# Patient Record
Sex: Female | Born: 1980 | Race: White | Hispanic: No | Smoking: Never smoker
Health system: Southern US, Community
[De-identification: ages and names within clinical notes are randomized; demographics above are authoritative.]

## PROBLEM LIST (undated history)

## (undated) DIAGNOSIS — J45909 Unspecified asthma, uncomplicated: Secondary | ICD-10-CM

## (undated) DIAGNOSIS — I429 Cardiomyopathy, unspecified: Secondary | ICD-10-CM

## (undated) DIAGNOSIS — F419 Anxiety disorder, unspecified: Secondary | ICD-10-CM

## (undated) DIAGNOSIS — L709 Acne, unspecified: Secondary | ICD-10-CM

## (undated) DIAGNOSIS — T7840XA Allergy, unspecified, initial encounter: Secondary | ICD-10-CM

## (undated) DIAGNOSIS — F32A Depression, unspecified: Secondary | ICD-10-CM

## (undated) DIAGNOSIS — G4730 Sleep apnea, unspecified: Secondary | ICD-10-CM

## (undated) DIAGNOSIS — Z801 Family history of malignant neoplasm of trachea, bronchus and lung: Secondary | ICD-10-CM

## (undated) DIAGNOSIS — Z800 Family history of malignant neoplasm of digestive organs: Secondary | ICD-10-CM

## (undated) HISTORY — DX: Anxiety disorder, unspecified: F41.9

## (undated) HISTORY — PX: TUBAL LIGATION: SHX77

## (undated) HISTORY — DX: Sleep apnea, unspecified: G47.30

## (undated) HISTORY — DX: Family history of malignant neoplasm of digestive organs: Z80.0

## (undated) HISTORY — DX: Allergy, unspecified, initial encounter: T78.40XA

## (undated) HISTORY — DX: Family history of malignant neoplasm of trachea, bronchus and lung: Z80.1

## (undated) HISTORY — DX: Cardiomyopathy, unspecified: I42.9

## (undated) HISTORY — DX: Depression, unspecified: F32.A

## (undated) HISTORY — DX: Unspecified asthma, uncomplicated: J45.909

---

## 2004-05-16 ENCOUNTER — Inpatient Hospital Stay: Payer: Self-pay | Admitting: Obstetrics & Gynecology

## 2004-06-03 ENCOUNTER — Emergency Department: Payer: Self-pay | Admitting: General Practice

## 2004-10-24 ENCOUNTER — Emergency Department: Payer: Self-pay | Admitting: Emergency Medicine

## 2005-01-21 ENCOUNTER — Emergency Department: Payer: Self-pay | Admitting: Emergency Medicine

## 2005-01-21 ENCOUNTER — Other Ambulatory Visit: Payer: Self-pay

## 2006-02-03 ENCOUNTER — Emergency Department: Payer: Self-pay | Admitting: Internal Medicine

## 2006-02-04 ENCOUNTER — Emergency Department: Payer: Self-pay | Admitting: General Practice

## 2006-04-11 ENCOUNTER — Emergency Department: Payer: Self-pay | Admitting: Unknown Physician Specialty

## 2006-08-04 ENCOUNTER — Ambulatory Visit: Payer: Self-pay | Admitting: Emergency Medicine

## 2006-08-04 ENCOUNTER — Emergency Department: Payer: Self-pay | Admitting: Emergency Medicine

## 2006-08-04 ENCOUNTER — Other Ambulatory Visit: Payer: Self-pay

## 2006-10-30 ENCOUNTER — Emergency Department: Payer: Self-pay | Admitting: Unknown Physician Specialty

## 2007-04-26 ENCOUNTER — Emergency Department: Payer: Self-pay | Admitting: Emergency Medicine

## 2007-05-05 ENCOUNTER — Emergency Department: Payer: Self-pay | Admitting: Emergency Medicine

## 2007-07-13 ENCOUNTER — Emergency Department: Payer: Self-pay | Admitting: Emergency Medicine

## 2007-12-31 ENCOUNTER — Emergency Department: Payer: Self-pay | Admitting: Emergency Medicine

## 2008-02-12 ENCOUNTER — Emergency Department: Payer: Self-pay | Admitting: Emergency Medicine

## 2008-05-08 ENCOUNTER — Emergency Department: Payer: Self-pay | Admitting: Emergency Medicine

## 2008-09-07 ENCOUNTER — Emergency Department: Payer: Self-pay | Admitting: Emergency Medicine

## 2008-10-14 ENCOUNTER — Emergency Department: Payer: Self-pay | Admitting: Emergency Medicine

## 2008-10-15 ENCOUNTER — Emergency Department: Payer: Self-pay | Admitting: Emergency Medicine

## 2008-12-14 ENCOUNTER — Emergency Department: Payer: Self-pay | Admitting: Internal Medicine

## 2009-06-11 ENCOUNTER — Emergency Department: Payer: Self-pay | Admitting: Unknown Physician Specialty

## 2009-07-18 ENCOUNTER — Emergency Department: Payer: Self-pay | Admitting: Emergency Medicine

## 2011-05-12 ENCOUNTER — Emergency Department: Payer: Self-pay | Admitting: Emergency Medicine

## 2013-10-02 ENCOUNTER — Emergency Department: Payer: Self-pay | Admitting: Emergency Medicine

## 2013-11-07 DIAGNOSIS — J4550 Severe persistent asthma, uncomplicated: Secondary | ICD-10-CM | POA: Insufficient documentation

## 2013-11-07 DIAGNOSIS — E669 Obesity, unspecified: Secondary | ICD-10-CM | POA: Insufficient documentation

## 2013-11-12 DIAGNOSIS — J45909 Unspecified asthma, uncomplicated: Secondary | ICD-10-CM | POA: Insufficient documentation

## 2014-04-01 ENCOUNTER — Emergency Department: Payer: Self-pay | Admitting: Emergency Medicine

## 2014-04-01 LAB — CBC
HCT: 40.3 % (ref 35.0–47.0)
HGB: 13.2 g/dL (ref 12.0–16.0)
MCH: 29.5 pg (ref 26.0–34.0)
MCHC: 32.8 g/dL (ref 32.0–36.0)
MCV: 90 fL (ref 80–100)
Platelet: 195 10*3/uL (ref 150–440)
RBC: 4.49 10*6/uL (ref 3.80–5.20)
RDW: 12.8 % (ref 11.5–14.5)
WBC: 10.7 10*3/uL (ref 3.6–11.0)

## 2014-04-01 LAB — BASIC METABOLIC PANEL
Anion Gap: 9 (ref 7–16)
BUN: 15 mg/dL (ref 7–18)
CALCIUM: 8.5 mg/dL (ref 8.5–10.1)
CO2: 28 mmol/L (ref 21–32)
Chloride: 105 mmol/L (ref 98–107)
Creatinine: 0.95 mg/dL (ref 0.60–1.30)
EGFR (African American): >60
EGFR (Non-African Amer.): >60
Glucose: 111 mg/dL — ABNORMAL HIGH (ref 65–99)
Osmolality: 285 (ref 275–301)
Potassium: 3.8 mmol/L (ref 3.5–5.1)
Sodium: 142 mmol/L (ref 136–145)

## 2014-04-01 LAB — TROPONIN I: Troponin-I: <0.02 ng/mL

## 2014-12-26 ENCOUNTER — Other Ambulatory Visit: Payer: Self-pay

## 2014-12-26 ENCOUNTER — Emergency Department
Admission: EM | Admit: 2014-12-26 | Discharge: 2014-12-26 | Disposition: A | Discharge status: Home / Self Care | Payer: Federal, State, Local not specified - PPO | Attending: Emergency Medicine | Admitting: Emergency Medicine

## 2014-12-26 DIAGNOSIS — R42 Dizziness and giddiness: Secondary | ICD-10-CM | POA: Insufficient documentation

## 2014-12-26 DIAGNOSIS — R5383 Other fatigue: Secondary | ICD-10-CM | POA: Diagnosis not present

## 2014-12-26 LAB — BASIC METABOLIC PANEL
Anion gap: 6 (ref 5–15)
BUN: 13 mg/dL (ref 6–20)
CALCIUM: 9.2 mg/dL (ref 8.9–10.3)
CHLORIDE: 106 mmol/L (ref 101–111)
CO2: 27 mmol/L (ref 22–32)
Creatinine, Ser: 0.64 mg/dL (ref 0.44–1.00)
GFR calc Af Amer: >60 mL/min (ref >60)
GFR calc non Af Amer: >60 mL/min (ref >60)
Glucose, Bld: 111 mg/dL — ABNORMAL HIGH (ref 65–99)
Potassium: 3.4 mmol/L — ABNORMAL LOW (ref 3.5–5.1)
Sodium: 139 mmol/L (ref 135–145)

## 2014-12-26 LAB — CBC
HCT: 43.2 % (ref 35.0–47.0)
HEMOGLOBIN: 14.1 g/dL (ref 12.0–16.0)
MCH: 29 pg (ref 26.0–34.0)
MCHC: 32.5 g/dL (ref 32.0–36.0)
MCV: 89.1 fL (ref 80.0–100.0)
Platelets: 195 10*3/uL (ref 150–440)
RBC: 4.85 MIL/uL (ref 3.80–5.20)
RDW: 13.4 % (ref 11.5–14.5)
WBC: 9.9 10*3/uL (ref 3.6–11.0)

## 2014-12-26 LAB — URINALYSIS COMPLETE WITH MICROSCOPIC (ARMC ONLY)
Bacteria, UA: NONE SEEN
Bilirubin Urine: NEGATIVE
Glucose, UA: NEGATIVE mg/dL
HGB URINE DIPSTICK: NEGATIVE
KETONES UR: NEGATIVE mg/dL
Leukocytes, UA: NEGATIVE
NITRITE: NEGATIVE
PH: 6 (ref 5.0–8.0)
PROTEIN: NEGATIVE mg/dL
SPECIFIC GRAVITY, URINE: 1.013 (ref 1.005–1.030)

## 2014-12-26 NOTE — ED Notes (Signed)
[facility] because of dizziness and losing her balance.

## 2014-12-26 NOTE — ED Provider Notes (Signed)
[facility] Emergency Department Provider Note   ____________________________________________  Time seen: 1600  I have reviewed the triage vital signs and the nursing notes.   HISTORY  Chief Complaint Dizziness   History limited by: Not Limited   HPI Stacy Yang is a 34 y.o. female presents to the emergency department today because of fatigue and lightheadedness. She states his symptoms have been going on for a couple of days. Whilst they have been constant and there are times when they're worse than other times. She denies any positional aspect to the dizziness. She describes it as feeling lightheaded and slightly woozy. She denies any sensation of the room spinning around her. She does state that she might not be sleeping as well as normally, she is under a lot of stress. Denies any fevers, chest pain.   No past medical history on file.  There are no active problems to display for this patient.   No past surgical history on file.  No current outpatient prescriptions on file.  Allergies Iodinated diagnostic agents and Sulfur  No family history on file.  Social History History  Substance Use Topics  . Smoking status: Not on file  . Smokeless tobacco: Not on file  . Alcohol Use: Not on file    Review of Systems  Constitutional: Negative for fever. Cardiovascular: Negative for chest pain. Respiratory: Negative for shortness of breath. Gastrointestinal: Negative for abdominal pain, vomiting and diarrhea. Genitourinary: Negative for dysuria. Musculoskeletal: Negative for back pain. Skin: Negative for rash. Neurological: Negative for headaches, focal weakness or numbness.   10-point ROS otherwise negative.  ____________________________________________   PHYSICAL EXAM:  VITAL SIGNS: ED Triage Vitals  Enc Vitals Group     BP 12/26/14 1229 118/86 mmHg     Pulse Rate 12/26/14 1229 82     Resp 12/26/14 1229 18     Temp 12/26/14  1229 98.7 F (37.1 C)     Temp Source 12/26/14 1229 Oral     SpO2 12/26/14 1229 100 %     Weight 12/26/14 1145 240 lb (108.863 kg)     Height 12/26/14 1145 5\' 4"  (1.626 m)     Head Cir --      Peak Flow --      Pain Score --      Pain Loc --      Pain Edu? --      Excl. in GC? --      Constitutional: Alert and oriented. Well appearing and in no distress. Eyes: Conjunctivae are normal. PERRL. Normal extraocular movements. ENT   Head: Normocephalic and atraumatic.   Nose: No congestion/rhinnorhea.   Mouth/Throat: Mucous membranes are moist.   Neck: No stridor. Hematological/Lymphatic/Immunilogical: No cervical lymphadenopathy. Cardiovascular: Normal rate, regular rhythm.  No murmurs, rubs, or gallops. Respiratory: Normal respiratory effort without tachypnea nor retractions. Breath sounds are clear and equal bilaterally. No wheezes/rales/rhonchi. Gastrointestinal: Soft and nontender. No distention. There is no CVA tenderness. Genitourinary: Deferred Musculoskeletal: Normal range of motion in all extremities. No joint effusions.  No lower extremity tenderness nor edema. Neurologic:  Normal speech and language. No gross focal neurologic deficits are appreciated. Speech is normal. No nystagmus. No ataxia. Skin:  Skin is warm, dry and intact. No rash noted. Psychiatric: Mood and affect are normal. Speech and behavior are normal. Patient exhibits appropriate insight and judgment.  ____________________________________________    LABS (pertinent positives/negatives)  Labs Reviewed  BASIC METABOLIC PANEL - Abnormal; Notable for the following:  Potassium 3.4 (*)    Glucose, Bld 111 (*)    All other components within normal limits  URINALYSIS COMPLETEWITH MICROSCOPIC ([facility])  - Abnormal; Notable for the following:    Color, Urine YELLOW (*)    APPearance CLEAR (*)    Squamous Epithelial / LPF 0-5 (*)    All other components within normal limits  CBC      ____________________________________________   EKG  EKG Time: 1239 Rate: 83 Rhythm: normal sinus rhythm Axis: normal Intervals: normal QRS: normal ST changes: no st elevation    ____________________________________________    RADIOLOGY  None  ____________________________________________   PROCEDURES  Procedure(s) performed: None  Critical Care performed: No  ____________________________________________   INITIAL IMPRESSION / ASSESSMENT AND PLAN / ED COURSE  Pertinent labs & imaging results that were available during my care of the patient were reviewed by me and considered in my medical decision making (see chart for details).  Patient here with fatigue and lightheadedness. Blood work without concerning findings. On exam no focal neuro deficits. Patient without any ataxia. No nystagmus.  Unclear etiology of fatigue. Due to question of patient might be suffering from some sleep apnea and discussed this with patient. Additionally patient is on a diet and we did discuss nutrition.  I do feel patient is safe for discharge.  ____________________________________________   FINAL CLINICAL IMPRESSION(S) / ED DIAGNOSES  Final diagnoses:  Other fatigue     Phineas SemenGraydon Lucca Ballo, MD 12/26/14 1635

## 2014-12-26 NOTE — Discharge Instructions (Signed)
Please seek medical attention for any high fevers, chest pain, shortness of breath, change in behavior, persistent vomiting, bloody stool or any other new or concerning symptoms. ° ° °Fatigue °Fatigue is a feeling of tiredness, lack of energy, lack of motivation, or feeling tired all the time. Having enough rest, good nutrition, and reducing stress will normally reduce fatigue. Consult your caregiver if it persists. The nature of your fatigue will help your caregiver to find out its cause. The treatment is based on the cause.  °CAUSES  °There are many causes for fatigue. Most of the time, fatigue can be traced to one or more of your habits or routines. Most causes fit into one or more of three general areas. They are: °Lifestyle problems °· Sleep disturbances. °· Overwork. °· Physical exertion. °· Unhealthy habits. °¨ Poor eating habits or eating disorders. °¨ Alcohol and/or drug use . °¨ Lack of proper nutrition (malnutrition). °Psychological problems °· Stress and/or anxiety problems. °· Depression. °· Grief. °· Boredom. °Medical Problems or Conditions °· Anemia. °· Pregnancy. °· Thyroid gland problems. °· Recovery from major surgery. °· Continuous pain. °· Emphysema or asthma that is not well controlled °· Allergic conditions. °· Diabetes. °· Infections (such as mononucleosis). °· Obesity. °· Sleep disorders, such as sleep apnea. °· Heart failure or other heart-related problems. °· Cancer. °· Kidney disease. °· Liver disease. °· Effects of certain medicines such as antihistamines, cough and cold remedies, prescription pain medicines, heart and blood pressure medicines, drugs used for treatment of cancer, and some antidepressants. °SYMPTOMS  °The symptoms of fatigue include:  °· Lack of energy. °· Lack of drive (motivation). °· Drowsiness. °· Feeling of indifference to the surroundings. °DIAGNOSIS  °The details of how you feel help guide your caregiver in finding out what is causing the fatigue. You will be asked  about your present and past health condition. It is important to review all medicines that you take, including prescription and non-prescription items. A thorough exam will be done. You will be questioned about your feelings, habits, and normal lifestyle. Your caregiver may suggest blood tests, urine tests, or other tests to look for common medical causes of fatigue.  °TREATMENT  °Fatigue is treated by correcting the underlying cause. For example, if you have continuous pain or depression, treating these causes will improve how you feel. Similarly, adjusting the dose of certain medicines will help in reducing fatigue.  °HOME CARE INSTRUCTIONS  °· Try to get the required amount of good sleep every night. °· Eat a healthy and nutritious diet, and drink enough water throughout the day. °· Practice ways of relaxing (including yoga or meditation). °· Exercise regularly. °· Make plans to change situations that cause stress. Act on those plans so that stresses decrease over time. Keep your work and personal routine reasonable. °· Avoid street drugs and minimize use of alcohol. °· Start taking a daily multivitamin after consulting your caregiver. °SEEK MEDICAL CARE IF:  °· You have persistent tiredness, which cannot be accounted for. °· You have fever. °· You have unintentional weight loss. °· You have headaches. °· You have disturbed sleep throughout the night. °· You are feeling sad. °· You have constipation. °· You have dry skin. °· You have gained weight. °· You are taking any new or different medicines that you suspect are causing fatigue. °· You are unable to sleep at night. °· You develop any unusual swelling of your legs or other parts of your body. °SEEK IMMEDIATE MEDICAL CARE IF:  °·   You are feeling confused. °· Your vision is blurred. °· You feel faint or pass out. °· You develop severe headache. °· You develop severe abdominal, pelvic, or back pain. °· You develop chest pain, shortness of breath, or an irregular  or fast heartbeat. °· You are unable to pass a normal amount of urine. °· You develop abnormal bleeding such as bleeding from the rectum or you vomit blood. °· You have thoughts about harming yourself or committing suicide. °· You are worried that you might harm someone else. °MAKE SURE YOU:  °· Understand these instructions. °· Will watch your condition. °· Will get help right away if you are not doing well or get worse. °Document Released: 06/01/2007 Document Revised: 10/27/2011 Document Reviewed: 12/06/2013 °[employer]® Patient Information ©2015 [employer]. This information is not intended to replace advice given to you by your health care provider. Make sure you discuss any questions you have with your health care provider. ° °

## 2015-01-12 DIAGNOSIS — M25561 Pain in right knee: Secondary | ICD-10-CM

## 2015-01-12 DIAGNOSIS — M25562 Pain in left knee: Secondary | ICD-10-CM

## 2015-01-12 DIAGNOSIS — K648 Other hemorrhoids: Secondary | ICD-10-CM | POA: Insufficient documentation

## 2015-01-12 DIAGNOSIS — R0683 Snoring: Secondary | ICD-10-CM | POA: Insufficient documentation

## 2015-01-12 DIAGNOSIS — G8929 Other chronic pain: Secondary | ICD-10-CM | POA: Insufficient documentation

## 2015-01-12 DIAGNOSIS — M545 Low back pain: Secondary | ICD-10-CM

## 2015-01-26 ENCOUNTER — Ambulatory Visit: Payer: Federal, State, Local not specified - PPO | Attending: Otolaryngology

## 2015-01-26 DIAGNOSIS — F5101 Primary insomnia: Secondary | ICD-10-CM | POA: Diagnosis not present

## 2015-01-26 DIAGNOSIS — R0683 Snoring: Secondary | ICD-10-CM | POA: Insufficient documentation

## 2015-02-16 ENCOUNTER — Ambulatory Visit (INDEPENDENT_AMBULATORY_CARE_PROVIDER_SITE_OTHER): Payer: Federal, State, Local not specified - PPO | Admitting: Podiatry

## 2015-02-16 ENCOUNTER — Ambulatory Visit: Payer: Self-pay

## 2015-02-16 VITALS — BP 124/72 | HR 67 | Resp 15

## 2015-02-16 DIAGNOSIS — M722 Plantar fascial fibromatosis: Secondary | ICD-10-CM | POA: Diagnosis not present

## 2015-02-16 DIAGNOSIS — M79671 Pain in right foot: Secondary | ICD-10-CM

## 2015-02-16 MED ORDER — METHYLPREDNISOLONE 4 MG PO TBPK: ORAL_TABLET | ORAL | Status: DC

## 2015-02-16 MED ORDER — TRIAMCINOLONE ACETONIDE 10 MG/ML IJ SUSP
10.0000 mg | Freq: Once | INTRAMUSCULAR | Status: AC
  Administered 2015-02-16: 10 mg

## 2015-02-16 NOTE — Progress Notes (Signed)
   Subjective:    Patient ID: Stacy Yang, female    DOB: [birth year], 34 y.o.   MRN: [id number]  HPI Pt presents with bilateral foot pain, right over left. She was diagnosed with plantar fasciitis from a previous visit to a podiatrist, she has tried stretching, ice nsaids. This has been an ongoing problem for 6 months and worsening, altering her gate and making it hard for her to walk   Review of Systems  Hematological: Bruises/bleeds easily.  All other systems reviewed and are negative.      Objective:   Physical Exam        Assessment & Plan:

## 2015-02-16 NOTE — Patient Instructions (Signed)

## 2015-02-18 NOTE — Progress Notes (Signed)
Subjective:     Patient ID: Stacy Yang, female   DOB: Sep 19, 1980, 34 y.o.   MRN: [id number]  HPI patient presents stating I have had severe pain in my plantar right heel for 6 months and gradual recurrence on the left heel over the last several months. States it's worse when getting up in the morning and after periods of sitting and now hurts at all times and is causing altering gait and inability to be active   Review of Systems  All other systems reviewed and are negative.      Objective:   Physical Exam  Constitutional: She is oriented to person, place, and time.  Cardiovascular: Intact distal pulses.   Musculoskeletal: Normal range of motion.  Neurological: She is oriented to person, place, and time.  Skin: Skin is warm.  Nursing note and vitals reviewed.  neurovascular status intact muscle strength adequate with range of motion of the subtalar and midtarsal joint within normal limits. Patient is noted to have good digital perfusion is well oriented 3 with moderate depression of the arch upon gait evaluation. Patient's plantar heel right is swollen with pain to palpation of the medial band at inserts into the calcaneus with mild discomfort also noted on the left     Assessment:     Plantar fasciitis acute nature right over left heel with inflammation and fluid buildup    Plan:     H&P and x-rays reviewed with patient. Today injected the right plantar fascia 3 mg Kenalog 5 mg Xylocaine and applied fascial brace. Placed on oral anti-inflammatory diclofenac 75 mg twice a day and gave instructions for physical therapy and shoe gear modifications. Reappoint to recheck in one week

## 2015-02-23 ENCOUNTER — Ambulatory Visit (INDEPENDENT_AMBULATORY_CARE_PROVIDER_SITE_OTHER): Payer: Federal, State, Local not specified - PPO | Admitting: Podiatry

## 2015-02-23 VITALS — BP 123/77 | HR 64 | Resp 12

## 2015-02-23 DIAGNOSIS — M722 Plantar fascial fibromatosis: Secondary | ICD-10-CM | POA: Diagnosis not present

## 2015-02-24 NOTE — Progress Notes (Signed)
Subjective:     Patient ID: Stacy Yang, female   DOB: 08/22/1980, 34 y.o.   MRN: [id number]  HPI patient states right heel is still worse than the left and they both bother me and make it hard to do significant ambulation. Patient also is noted to have flattening arches which are a part of the pathological process   Review of Systems     Objective:   Physical Exam Neurovascular status intact muscle strength adequate range of motion unchanged with significant discomfort in the plantar fascial right over left with depression of the arch noted and pain that's most prevalent after periods of sitting and when getting up in the morning    Assessment:     Plantar fasciitis acute nature right over left with structural deformity    Plan:     Reviewed condition and at this time went ahead and scanned for custom Berkley type orthotics to give support to the arch and to stabilize the heel. Then dispensed a night splint with instructions on usage and reappoint when orthotics are ready and also advised on ice therapy and shoe gear modification

## 2015-03-16 ENCOUNTER — Ambulatory Visit: Payer: Federal, State, Local not specified - PPO | Admitting: *Deleted

## 2015-03-16 DIAGNOSIS — M722 Plantar fascial fibromatosis: Secondary | ICD-10-CM

## 2015-03-16 NOTE — Patient Instructions (Signed)

## 2015-03-16 NOTE — Progress Notes (Signed)
Patient ID: Stacy Yang, female   DOB: 01-28-1981, 34 y.o.   MRN: [id number] Patient presents for orthotic pick up.  Verbal and written break in and wear instructions given.  Patient will follow up in 4 weeks if symptoms worsen or fail to improve.

## 2015-04-27 DIAGNOSIS — I959 Hypotension, unspecified: Secondary | ICD-10-CM | POA: Insufficient documentation

## 2016-05-07 ENCOUNTER — Emergency Department
Admission: EM | Admit: 2016-05-07 | Discharge: 2016-05-07 | Disposition: A | Discharge status: Home / Self Care | Payer: Federal, State, Local not specified - PPO | Attending: Emergency Medicine | Admitting: Emergency Medicine

## 2016-05-07 DIAGNOSIS — K2970 Gastritis, unspecified, without bleeding: Secondary | ICD-10-CM | POA: Insufficient documentation

## 2016-05-07 DIAGNOSIS — R0789 Other chest pain: Secondary | ICD-10-CM | POA: Diagnosis present

## 2016-05-07 DIAGNOSIS — Z79899 Other long term (current) drug therapy: Secondary | ICD-10-CM | POA: Insufficient documentation

## 2016-05-07 LAB — COMPREHENSIVE METABOLIC PANEL
ALBUMIN: 3.9 g/dL (ref 3.5–5.0)
ALT: 13 U/L — ABNORMAL LOW (ref 14–54)
AST: 16 U/L (ref 15–41)
Alkaline Phosphatase: 67 U/L (ref 38–126)
Anion gap: 6 (ref 5–15)
BUN: 15 mg/dL (ref 6–20)
CALCIUM: 9.7 mg/dL (ref 8.9–10.3)
CO2: 30 mmol/L (ref 22–32)
CREATININE: 0.85 mg/dL (ref 0.44–1.00)
Chloride: 102 mmol/L (ref 101–111)
Glucose, Bld: 117 mg/dL — ABNORMAL HIGH (ref 65–99)
Potassium: 3.9 mmol/L (ref 3.5–5.1)
Sodium: 138 mmol/L (ref 135–145)
Total Bilirubin: 0.8 mg/dL (ref 0.3–1.2)
Total Protein: 7.5 g/dL (ref 6.5–8.1)

## 2016-05-07 LAB — CBC
HCT: 40.5 % (ref 35.0–47.0)
Hemoglobin: 14 g/dL (ref 12.0–16.0)
MCH: 30.7 pg (ref 26.0–34.0)
MCHC: 34.5 g/dL (ref 32.0–36.0)
MCV: 89.1 fL (ref 80.0–100.0)
PLATELETS: 212 10*3/uL (ref 150–440)
RBC: 4.55 MIL/uL (ref 3.80–5.20)
RDW: 12.8 % (ref 11.5–14.5)
WBC: 11 10*3/uL (ref 3.6–11.0)

## 2016-05-07 LAB — TROPONIN I

## 2016-05-07 MED ORDER — ONDANSETRON 4 MG PO TBDP
4.0000 mg | ORAL_TABLET | Freq: Three times a day (TID) | ORAL | 0 refills | Status: DC | PRN
Start: 2016-05-07 — End: 2017-05-29

## 2016-05-07 MED ORDER — FAMOTIDINE 20 MG PO TABS: 20.0000 mg | ORAL_TABLET | Freq: Two times a day (BID) | ORAL | 0 refills | Status: DC

## 2016-05-07 MED ORDER — GI COCKTAIL ~~LOC~~
30.0000 mL | ORAL | Status: AC
  Administered 2016-05-07: 30 mL via ORAL

## 2016-05-07 MED ORDER — FAMOTIDINE 20 MG PO TABS
40.0000 mg | ORAL_TABLET | Freq: Once | ORAL | Status: AC
  Administered 2016-05-07: 40 mg via ORAL

## 2016-05-07 NOTE — ED Provider Notes (Signed)
[facility] Emergency Department Provider Note  ____________________________________________  Time seen: Approximately 10:01 PM  I have reviewed the triage vital signs and the nursing notes.   HISTORY  Chief Complaint Chest Pain    HPI VANNIE HILGERT is a 35 y.o. female who complains of intermittent aching chest pain in the center of the chest, nonradiating, no associated shortness of breath diaphoresis or vomiting. Not exertional, not pleuritic. She recently was diagnosed with strep throat and started amoxicillin 2 days ago. Also taking NSAIDs. Pain started today around lunchtime before eating. She was still able to eat without difficulty. Eating and drinking normally, normal energy. No fevers chills or sweats.     No past medical history on file. None  There are no active problems to display for this patient.    No past surgical history on file. None  Prior to Admission medications   Medication Sig Start Date End Date Taking? Authorizing Provider  famotidine (PEPCID) 20 MG tablet Take 1 tablet (20 mg total) by mouth 2 (two) times daily. 05/07/16   Sharman Cheek, MD  methylPREDNISolone (MEDROL DOSEPAK) 4 MG TBPK tablet follow package directions 02/16/15   Kirstie Peri Regal, DPM  ondansetron (ZOFRAN ODT) 4 MG disintegrating tablet Take 1 tablet (4 mg total) by mouth every 8 (eight) hours as needed for nausea or vomiting. 05/07/16   Sharman Cheek, MD  terbinafine (LAMISIL) 250 MG tablet Take 250 mg by mouth daily.    Historical Provider, MD     Allergies Iodinated diagnostic agents and Sulfur   No family history on file.  Social History Social History  Substance Use Topics  . Smoking status: Not on file  . Smokeless tobacco: Not on file  . Alcohol use Not on file  No tobacco alcohol or drug use  Review of Systems  Constitutional:   No fever or chills.  ENT:   Positive but improving sore throat. No rhinorrhea. Cardiovascular:    Positive as above chest pain. Respiratory:   No dyspnea , occasional nonproductive cough. Gastrointestinal:   Negative for abdominal pain, vomiting and diarrhea.   10-point ROS otherwise negative.  ____________________________________________   PHYSICAL EXAM:  VITAL SIGNS: ED Triage Vitals  Enc Vitals Group     BP 05/07/16 2016 109/71     Pulse Rate 05/07/16 2016 77     Resp 05/07/16 2016 18     Temp 05/07/16 2016 98.9 F (37.2 C)     Temp Source 05/07/16 2016 Oral     SpO2 05/07/16 2016 99 %     Weight 05/07/16 2017 170 lb (77.1 kg)     Height 05/07/16 2017 5' (1.524 m)     Head Circumference --      Peak Flow --      Pain Score 05/07/16 2017 8     Pain Loc --      Pain Edu? --      Excl. in GC? --     Vital signs reviewed, nursing assessments reviewed.   Constitutional:   Alert and oriented. Well appearing and in no distress. Eyes:   No scleral icterus. No conjunctival pallor. PERRL. EOMI.  No nystagmus. ENT   Head:   Normocephalic and atraumatic.   Nose:   No congestion/rhinnorhea. No septal hematoma   Mouth/Throat:   MMM, Positive pharyngeal erythema. No peritonsillar mass.    Neck:   No stridor. No SubQ emphysema. No meningismus. Hematological/Lymphatic/Immunilogical:   No cervical lymphadenopathy. Cardiovascular:   RRR. Symmetric  bilateral radial and DP pulses.  No murmurs.  Respiratory:   Normal respiratory effort without tachypnea nor retractions. Breath sounds are clear and equal bilaterally. No wheezes/rales/rhonchi.Chest wall nontender Gastrointestinal:   Soft and nontender. Non distended. There is no CVA tenderness.  No rebound, rigidity, or guarding. Mild epigastric and left upper quadrant tenderness Genitourinary:   deferred Musculoskeletal:   Nontender with normal range of motion in all extremities. No joint effusions.  No lower extremity tenderness.  No edema. Neurologic:   Normal speech and language.  CN 2-10 normal. Motor grossly  intact. No gross focal neurologic deficits are appreciated.  Skin:    Skin is warm, dry and intact. No rash noted.  No petechiae, purpura, or bullae.  ____________________________________________    LABS (pertinent positives/negatives) (all labs ordered are listed, but only abnormal results are displayed) Labs Reviewed  COMPREHENSIVE METABOLIC PANEL - Abnormal; Notable for the following:       Result Value   Glucose, Bld 117 (*)    ALT 13 (*)    All other components within normal limits  CBC  TROPONIN I   ____________________________________________   EKG  Interpreted by me  Date: 05/07/2016  Rate: 79  Rhythm: normal sinus rhythm  QRS Axis: normal  Intervals: normal  ST/T Wave abnormalities: normal  Conduction Disutrbances: none  Narrative Interpretation: unremarkable      ____________________________________________    RADIOLOGY    ____________________________________________   PROCEDURES Procedures  ____________________________________________   INITIAL IMPRESSION / ASSESSMENT AND PLAN / ED COURSE  Pertinent labs & imaging results that were available during my care of the patient were reviewed by me and considered in my medical decision making (see chart for details).  Patient well appearing no acute distress. Presents with atypical chest pain. Vital signs unremarkable, EKG unremarkable.Considering the patient's symptoms, medical history, and physical examination today, I have low suspicion for ACS, PE, TAD, pneumothorax, carditis, mediastinitis, pneumonia, CHF, or sepsis.  Presentation is consistent with gastritis, likely due to confluence of NSAIDs pharyngitis and strep infection and amoxicillin. We'll give GI cocktail and Pepcid, reassurance, follow up with primary care. Usual return precautions given.     Clinical Course   ____________________________________________   FINAL CLINICAL IMPRESSION(S) / ED DIAGNOSES  Final diagnoses:   Gastritis  Atypical chest pain       Portions of this note were generated with dragon dictation software. Dictation errors may occur despite best attempts at proofreading.    Sharman CheekPhillip Samaia Iwata, MD 05/07/16 2205

## 2016-05-07 NOTE — ED Triage Notes (Signed)
Pt in with co sore throat since Monday, started on antibiotics after being dx with strep. States today developed chest pain and nausea.

## 2016-08-26 DIAGNOSIS — J9801 Acute bronchospasm: Secondary | ICD-10-CM | POA: Diagnosis not present

## 2016-08-26 DIAGNOSIS — R52 Pain, unspecified: Secondary | ICD-10-CM | POA: Diagnosis not present

## 2016-08-26 DIAGNOSIS — J208 Acute bronchitis due to other specified organisms: Secondary | ICD-10-CM | POA: Diagnosis not present

## 2016-08-26 DIAGNOSIS — J019 Acute sinusitis, unspecified: Secondary | ICD-10-CM | POA: Diagnosis not present

## 2016-11-28 DIAGNOSIS — K080 Exfoliation of teeth due to systemic causes: Secondary | ICD-10-CM | POA: Diagnosis not present

## 2017-04-01 DIAGNOSIS — Z113 Encounter for screening for infections with a predominantly sexual mode of transmission: Secondary | ICD-10-CM | POA: Diagnosis not present

## 2017-04-01 DIAGNOSIS — O903 Peripartum cardiomyopathy: Secondary | ICD-10-CM | POA: Insufficient documentation

## 2017-04-01 DIAGNOSIS — N946 Dysmenorrhea, unspecified: Secondary | ICD-10-CM | POA: Diagnosis not present

## 2017-04-01 DIAGNOSIS — N920 Excessive and frequent menstruation with regular cycle: Secondary | ICD-10-CM | POA: Diagnosis not present

## 2017-04-01 DIAGNOSIS — Z01419 Encounter for gynecological examination (general) (routine) without abnormal findings: Secondary | ICD-10-CM | POA: Diagnosis not present

## 2017-04-17 DIAGNOSIS — N946 Dysmenorrhea, unspecified: Secondary | ICD-10-CM | POA: Diagnosis not present

## 2017-04-17 DIAGNOSIS — N920 Excessive and frequent menstruation with regular cycle: Secondary | ICD-10-CM | POA: Diagnosis not present

## 2017-04-21 ENCOUNTER — Emergency Department
Admission: EM | Admit: 2017-04-21 | Discharge: 2017-04-21 | Disposition: A | Discharge status: Home / Self Care | Payer: Federal, State, Local not specified - PPO | Attending: Emergency Medicine | Admitting: Emergency Medicine

## 2017-04-21 ENCOUNTER — Encounter: Payer: Self-pay | Admitting: *Deleted

## 2017-04-21 DIAGNOSIS — Z79899 Other long term (current) drug therapy: Secondary | ICD-10-CM | POA: Diagnosis not present

## 2017-04-21 DIAGNOSIS — M79672 Pain in left foot: Secondary | ICD-10-CM | POA: Diagnosis not present

## 2017-04-21 DIAGNOSIS — M722 Plantar fascial fibromatosis: Secondary | ICD-10-CM

## 2017-04-21 MED ORDER — PREDNISONE 10 MG (21) PO TBPK: ORAL_TABLET | ORAL | 0 refills | Status: DC

## 2017-04-21 MED ORDER — DEXAMETHASONE SODIUM PHOSPHATE 10 MG/ML IJ SOLN: 10.0000 mg | Freq: Once | INTRAMUSCULAR | Status: DC

## 2017-04-21 MED ORDER — PREDNISONE 20 MG PO TABS
60.0000 mg | ORAL_TABLET | Freq: Once | ORAL | Status: AC
  Administered 2017-04-21: 60 mg via ORAL
  Filled 2017-04-21: qty 3

## 2017-04-21 NOTE — Discharge Instructions (Signed)
Take medication as prescribed. Return to emergency department if symptoms worsen and follow-up with PCP as needed.    Until symptoms improve where shoes with an arch support. Utilize cold therapy for pain and inflammation.

## 2017-04-21 NOTE — ED Triage Notes (Signed)
Pt complains of left foot pain, pt denies injury

## 2017-04-21 NOTE — ED Provider Notes (Signed)
[facility] Emergency Department Provider Note   ____________________________________________   I have reviewed the triage vital signs and the nursing notes.   HISTORY  Chief Complaint Foot Pain    HPI Stacy Yang is a 36 y.o. female presents to the emergency department with left foot pain that developed after having dinner last night. Patient reports after she stood up from dinner she noted pain along the plantar aspect of her foot however it was different from plantar fascia pain. Patient reports having history of left foot R fasciitis. Patient noted when she awoke this morning difficulty with weightbearing. Patient normally wears arch support shoes at work. 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 Patient does endorse wearing flip-flops quite often. Patient localizes her pain along the medial longitudinal arch of the left foot with mild swelling. She denies any traumatic injury or falls Patient denies fever, chills, headache, vision changes, chest pain, chest tightness, shortness of breath, abdominal pain, nausea and vomiting.  History reviewed. No pertinent past medical history.  There are no active problems to display for this patient.   History reviewed. No pertinent surgical history.  Prior to Admission medications   Medication Sig Start Date End Date Taking? Authorizing Provider  famotidine (PEPCID) 20 MG tablet Take 1 tablet (20 mg total) by mouth 2 (two) times daily. 05/07/16   Sharman Cheek, MD  methylPREDNISolone (MEDROL DOSEPAK) 4 MG TBPK tablet follow package directions 02/16/15   Lenn Sink, DPM  ondansetron (ZOFRAN ODT) 4 MG disintegrating tablet Take 1 tablet (4 mg total) by mouth every 8 (eight) hours as needed for nausea or vomiting. 05/07/16   Sharman Cheek, MD  predniSONE (STERAPRED UNI-PAK 21 TAB) 10 MG (21) TBPK tablet Take 6 tablets on day 1. Take 5 tablets on day 2. Take 4 tablets on day 3. Take  3 tablets on day 4. Take 2 tablets on day 5. Take 1 tablets on day 6. 04/21/17   Yordan Martindale M, PA-C  terbinafine (LAMISIL) 250 MG tablet Take 250 mg by mouth daily.    [provider]    Allergies Iodinated diagnostic agents and Sulfur  No family history on file.  Social History Social History  Substance Use Topics  . Smoking status: Not on file  . Smokeless tobacco: Not on file  . Alcohol use No    Review of Systems Constitutional: Negative for fever/chills Eyes: No visual changes. ENT:  Negative for sore throat and for difficulty swallowing Cardiovascular: Denies chest pain. Respiratory: Denies cough. Denies shortness of breath. Gastrointestinal: No abdominal pain.  No nausea, vomiting, diarrhea. Genitourinary: Negative for dysuria. Musculoskeletal: Left plantar foot pain. Skin: Negative for rash. Neurological: Negative for headaches.  Negative focal weakness or numbness. Negative for loss of consciousness. Able to ambulate. ____________________________________________   PHYSICAL EXAM:  VITAL SIGNS: ED Triage Vitals [04/21/17 0930]  Enc Vitals Group     BP 125/75     Pulse Rate 78     Resp 20     Temp 98.4 F (36.9 C)     Temp Source Oral     SpO2 100 %     Weight 175 lb (79.4 kg)     Height 5' (1.524 m)     Head Circumference      Peak Flow      Pain Score 8     Pain Loc      Pain Edu?      Excl. in GC?  Constitutional: Alert and oriented. Well appearing and in no acute distress.  Eyes: Conjunctivae are normal. PERRL. EOMI  Head: Normocephalic and atraumatic. ENT:      Ears: Canals clear. TMs intact bilaterally.      Nose: No congestion/rhinnorhea.      Mouth/Throat: Mucous membranes are moist. Neck:Supple. No thyromegaly. No stridor.  Cardiovascular: Normal rate, regular rhythm. Normal S1 and S2.  Good peripheral circulation. Respiratory: Normal respiratory effort without tachypnea or retractions. Lungs CTAB. No wheezes/rales/rhonchi.  Good air entry to the bases with no decreased or absent breath sounds. Hematological/Lymphatic/Immunological: No cervical lymphadenopathy. Cardiovascular: Normal rate, regular rhythm. Normal distal pulses. Gastrointestinal: Bowel sounds 4 quadrants. Soft and nontender to palpation.  Musculoskeletal: Left plantar pain along the medial longitudinal arch with mild swelling. Intact range of motion, strength and sensation of the left ankle and foot. Neurologic: Normal speech and language.  Skin:  Skin is warm, dry and intact. No rash noted. Psychiatric: Mood and affect are normal. Speech and behavior are normal. Patient exhibits appropriate insight and judgement.  ____________________________________________   LABS (all labs ordered are listed, but only abnormal results are displayed)  Labs Reviewed - No data to display ____________________________________________  EKG None ____________________________________________  RADIOLOGY None ____________________________________________   PROCEDURES  Procedure(s) performed: No    Critical Care performed: no ____________________________________________   INITIAL IMPRESSION / ASSESSMENT AND PLAN / ED COURSE  Pertinent labs & imaging results that were available during my care of the patient were reviewed by me and considered in my medical decision making (see chart for details).   Patient presents to emergency department with left plantar foot pain. History and physical exam findings are reassuring symptoms are consistent with inflammation and irritation of the medial longitudinal arch and plantar fascia. Patient will be prescribed a prednisone taper. Patient advised to follow up with primary care provider or podiatrist as needed or return to the emergency department if symptoms return or worsen.  ____________________________________________   FINAL CLINICAL IMPRESSION(S) / ED DIAGNOSES  Final diagnoses:  Plantar fasciitis of left  foot  Arch pain of left foot       NEW MEDICATIONS STARTED DURING THIS VISIT:  Discharge Medication List as of 04/21/2017 10:35 AM    START taking these medications   Details  predniSONE (STERAPRED UNI-PAK 21 TAB) 10 MG (21) TBPK tablet Take 6 tablets on day 1. Take 5 tablets on day 2. Take 4 tablets on day 3. Take 3 tablets on day 4. Take 2 tablets on day 5. Take 1 tablets on day 6., Print         Note:  This document was prepared using Dragon voice recognition software and may include unintentional dictation errors.    Clois ComberLittle, Mical Brun M, PA-C 04/21/17 1440    Jene EveryKinner, Robert, MD 04/21/17 [phone]

## 2017-05-29 ENCOUNTER — Encounter: Payer: Self-pay | Admitting: Family Medicine

## 2017-05-29 ENCOUNTER — Ambulatory Visit (INDEPENDENT_AMBULATORY_CARE_PROVIDER_SITE_OTHER): Payer: Federal, State, Local not specified - PPO | Admitting: Family Medicine

## 2017-05-29 VITALS — BP 110/80 | HR 71 | Temp 98.8°F | Ht 62.0 in | Wt 191.4 lb

## 2017-05-29 DIAGNOSIS — F32A Depression, unspecified: Secondary | ICD-10-CM

## 2017-05-29 DIAGNOSIS — R239 Unspecified skin changes: Secondary | ICD-10-CM | POA: Diagnosis not present

## 2017-05-29 DIAGNOSIS — R109 Unspecified abdominal pain: Secondary | ICD-10-CM | POA: Diagnosis not present

## 2017-05-29 DIAGNOSIS — F419 Anxiety disorder, unspecified: Secondary | ICD-10-CM

## 2017-05-29 DIAGNOSIS — G8929 Other chronic pain: Secondary | ICD-10-CM

## 2017-05-29 DIAGNOSIS — R1310 Dysphagia, unspecified: Secondary | ICD-10-CM | POA: Diagnosis not present

## 2017-05-29 DIAGNOSIS — F329 Major depressive disorder, single episode, unspecified: Secondary | ICD-10-CM | POA: Diagnosis not present

## 2017-05-29 MED ORDER — ESCITALOPRAM OXALATE 10 MG PO TABS: 10.0000 mg | ORAL_TABLET | Freq: Every day | ORAL | 1 refills | Status: DC

## 2017-05-29 NOTE — Progress Notes (Signed)
Spoke with a adult Chiropractor, gave brief summary of concern.  She is not sure if it is a valid report so she is going to check with another employee and give me a call back to see if a report can be filed or if we need to go a different avenue.  Thanks
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
## 2017-05-29 NOTE — Assessment & Plan Note (Signed)
Refer to dermatology for skin exam. 

## 2017-05-29 NOTE — Assessment & Plan Note (Signed)
Refer to GI 

## 2017-05-29 NOTE — Progress Notes (Signed)
Gave full report to intake worker, we will be notified if it goes further. thanks

## 2017-05-29 NOTE — Progress Notes (Signed)
I have left a message to have a case worker call me back

## 2017-05-29 NOTE — Assessment & Plan Note (Signed)
Chronic issue. Evaluated by GI previously. She would like further evaluation with a new GI office. Referral has been placed.

## 2017-05-29 NOTE — Patient Instructions (Signed)
Nice to see you. We will get you to see GI for your chronic abdominal pain and swallowing issues. If you get to the point where you cannot swallow or it becomes increasingly difficult please be evaluated immediately. We are going to start you on Lexapro for your anxiety and depression. This may take several weeks to start to work. If you feel as though you or your children are in danger with regards to your neighbors you should call the police. We will be contacting the Department of Social Services to see if there is anything they need to do.

## 2017-05-29 NOTE — Assessment & Plan Note (Signed)
Patient with fairly significant anxiety and depression. No SI or HI. She's been going through divorce and has had issues with her neighbors. Discussed options for therapy and/or medication. Patient opted for medication. Will start on Lexapro. We'll see back in 2 months. Given return precautions.

## 2017-06-08 ENCOUNTER — Encounter: Payer: Self-pay | Admitting: Family Medicine

## 2017-06-26 ENCOUNTER — Encounter: Payer: Self-pay | Admitting: Gastroenterology

## 2017-06-26 ENCOUNTER — Ambulatory Visit (INDEPENDENT_AMBULATORY_CARE_PROVIDER_SITE_OTHER): Payer: Federal, State, Local not specified - PPO | Admitting: Gastroenterology

## 2017-06-26 ENCOUNTER — Other Ambulatory Visit: Payer: Self-pay

## 2017-06-26 ENCOUNTER — Encounter (INDEPENDENT_AMBULATORY_CARE_PROVIDER_SITE_OTHER): Payer: Self-pay

## 2017-06-26 VITALS — BP 126/84 | HR 90 | Temp 98.2°F | Wt 196.0 lb

## 2017-06-26 DIAGNOSIS — R1310 Dysphagia, unspecified: Secondary | ICD-10-CM

## 2017-06-26 DIAGNOSIS — K582 Mixed irritable bowel syndrome: Secondary | ICD-10-CM

## 2017-06-26 DIAGNOSIS — K589 Irritable bowel syndrome without diarrhea: Secondary | ICD-10-CM | POA: Insufficient documentation

## 2017-06-26 MED ORDER — OMEPRAZOLE 40 MG PO CPDR: 40.0000 mg | DELAYED_RELEASE_CAPSULE | Freq: Every day | ORAL | 0 refills | Status: DC

## 2017-06-26 MED ORDER — AMITRIPTYLINE HCL 25 MG PO TABS: 50.0000 mg | ORAL_TABLET | Freq: Every day | ORAL | 1 refills | Status: DC
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
## 2017-06-30 ENCOUNTER — Telehealth: Payer: Self-pay | Admitting: Gastroenterology

## 2017-06-30 MED ORDER — DESIPRAMINE HCL 25 MG PO TABS: 25.0000 mg | ORAL_TABLET | Freq: Every day | ORAL | 0 refills | Status: DC

## 2017-06-30 NOTE — Telephone Encounter (Signed)
Amitriptyline 25 mg is making her feel sluggish and much too sleepy. She can barley function home or work.  However, she noticed about her bowel movements are more regular.  Recommended her to stop amitriptyline.  And we will try desipramine.  Arlyss Repressohini R Brookelin Felber, MD [address]  [address]  [place], [place] [address]  Main: [phone]  Fax: [phone] Pager: [phone]

## 2017-06-30 NOTE — Telephone Encounter (Signed)
Patient called and the medication Amitriptyline 25 mg is making her feel sluggish and much too sleepy. She can barley function home or work. Please call patient.

## 2017-07-03 DIAGNOSIS — L710 Perioral dermatitis: Secondary | ICD-10-CM | POA: Diagnosis not present

## 2017-07-03 DIAGNOSIS — L219 Seborrheic dermatitis, unspecified: Secondary | ICD-10-CM | POA: Diagnosis not present

## 2017-07-03 DIAGNOSIS — D229 Melanocytic nevi, unspecified: Secondary | ICD-10-CM | POA: Diagnosis not present

## 2017-07-03 DIAGNOSIS — L814 Other melanin hyperpigmentation: Secondary | ICD-10-CM | POA: Diagnosis not present

## 2017-07-04 NOTE — Telephone Encounter (Signed)
I already did and changed to a different medication   -RV

## 2017-07-14 NOTE — Discharge Instructions (Signed)
General Anesthesia, Adult, Care After °These instructions provide you with information about caring for yourself after your procedure. Your health care provider may also give you more specific instructions. Your treatment has been planned according to current medical practices, but problems sometimes occur. Call your health care provider if you have any problems or questions after your procedure. °What can I expect after the procedure? °After the procedure, it is common to have: °· Vomiting. °· A sore throat. °· Mental slowness. ° °It is common to feel: °· Nauseous. °· Cold or shivery. °· Sleepy. °· Tired. °· Sore or achy, even in parts of your body where you did not have surgery. ° °Follow these instructions at home: °For at least 24 hours after the procedure: °· Do not: °? Participate in activities where you could fall or become injured. °? Drive. °? Use heavy machinery. °? Drink alcohol. °? Take sleeping pills or medicines that cause drowsiness. °? Make important decisions or sign legal documents. °? Take care of children on your own. °· Rest. °Eating and drinking °· If you vomit, drink water, juice, or soup when you can drink without vomiting. °· Drink enough fluid to keep your urine clear or pale yellow. °· Make sure you have little or no nausea before eating solid foods. °· Follow the diet recommended by your health care provider. °General instructions °· Have a responsible adult stay with you until you are awake and alert. °· Return to your normal activities as told by your health care provider. Ask your health care provider what activities are safe for you. °· Take over-the-counter and prescription medicines only as told by your health care provider. °· If you smoke, do not smoke without supervision. °· Keep all follow-up visits as told by your health care provider. This is important. °Contact a health care provider if: °· You continue to have nausea or vomiting at home, and medicines are not helpful. °· You  cannot drink fluids or start eating again. °· You cannot urinate after 8-12 hours. °· You develop a skin rash. °· You have fever. °· You have increasing redness at the site of your procedure. °Get help right away if: °· You have difficulty breathing. °· You have chest pain. °· You have unexpected bleeding. °· You feel that you are having a life-threatening or urgent problem. °This information is not intended to replace advice given to you by your health care provider. Make sure you discuss any questions you have with your health care provider. °Document Released: 11/10/2000 Document Revised: 01/07/2016 Document Reviewed: 07/19/2015 °Elsevier Interactive Patient Education © 2018 [employer] ° °

## 2017-07-15 ENCOUNTER — Encounter: Payer: Self-pay | Admitting: *Deleted

## 2017-07-15 ENCOUNTER — Ambulatory Visit
Admission: RE | Admit: 2017-07-15 | Discharge: 2017-07-15 | Disposition: A | Discharge status: Home / Self Care | Payer: Federal, State, Local not specified - PPO | Source: Ambulatory Visit | Attending: Gastroenterology | Admitting: Gastroenterology

## 2017-07-15 ENCOUNTER — Encounter
Admission: RE | Disposition: A | Discharge status: Home / Self Care | Payer: Self-pay | Source: Ambulatory Visit | Attending: Gastroenterology

## 2017-07-15 ENCOUNTER — Ambulatory Visit: Payer: Federal, State, Local not specified - PPO | Admitting: Anesthesiology

## 2017-07-15 DIAGNOSIS — Z882 Allergy status to sulfonamides status: Secondary | ICD-10-CM | POA: Insufficient documentation

## 2017-07-15 DIAGNOSIS — Z79899 Other long term (current) drug therapy: Secondary | ICD-10-CM | POA: Insufficient documentation

## 2017-07-15 DIAGNOSIS — K219 Gastro-esophageal reflux disease without esophagitis: Secondary | ICD-10-CM | POA: Diagnosis not present

## 2017-07-15 DIAGNOSIS — R1310 Dysphagia, unspecified: Secondary | ICD-10-CM | POA: Diagnosis not present

## 2017-07-15 HISTORY — DX: Acne, unspecified: L70.9

## 2017-07-15 HISTORY — PX: ESOPHAGOGASTRODUODENOSCOPY (EGD) WITH PROPOFOL: SHX5813

## 2017-07-15 SURGERY — ESOPHAGOGASTRODUODENOSCOPY (EGD) WITH PROPOFOL
Anesthesia: General

## 2017-07-15 MED ORDER — ONDANSETRON HCL 4 MG/2ML IJ SOLN: 4.0000 mg | Freq: Once | INTRAMUSCULAR | Status: DC | PRN

## 2017-07-15 MED ORDER — PROPOFOL 10 MG/ML IV BOLUS
INTRAVENOUS | Status: DC | PRN
  Administered 2017-07-15 (×3): 50 mg via INTRAVENOUS
  Administered 2017-07-15: 100 mg via INTRAVENOUS
  Administered 2017-07-15: 50 mg via INTRAVENOUS

## 2017-07-15 MED ORDER — ACETAMINOPHEN 325 MG PO TABS: 325.0000 mg | ORAL_TABLET | ORAL | Status: DC | PRN

## 2017-07-15 MED ORDER — LIDOCAINE HCL (CARDIAC) 20 MG/ML IV SOLN
INTRAVENOUS | Status: DC | PRN
  Administered 2017-07-15: 40 mg via INTRAVENOUS

## 2017-07-15 MED ORDER — GLYCOPYRROLATE 0.2 MG/ML IJ SOLN
INTRAMUSCULAR | Status: DC | PRN
  Administered 2017-07-15: .2 mg via INTRAVENOUS

## 2017-07-15 MED ORDER — ACETAMINOPHEN 160 MG/5ML PO SOLN: 325.0000 mg | ORAL | Status: DC | PRN

## 2017-07-15 MED ORDER — LACTATED RINGERS IV SOLN
10.0000 mL/h | INTRAVENOUS | Status: DC
  Administered 2017-07-15: 10 mL/h via INTRAVENOUS

## 2017-07-15 SURGICAL SUPPLY — 32 items
BALLN DILATOR 10-12 8 (BALLOONS)
BALLN DILATOR 12-15 8 (BALLOONS)
BALLN DILATOR 15-18 8 (BALLOONS)
BALLN DILATOR CRE 0-12 8 (BALLOONS)
BALLN DILATOR ESOPH 8 10 CRE (MISCELLANEOUS) IMPLANT
BALLOON DILATOR 12-15 8 (BALLOONS) IMPLANT
BALLOON DILATOR 15-18 8 (BALLOONS) IMPLANT
BALLOON DILATOR CRE 0-12 8 (BALLOONS) IMPLANT
BLOCK BITE 60FR ADLT L/F GRN (MISCELLANEOUS) ×2 IMPLANT
CANISTER SUCT 1200ML W/VALVE (MISCELLANEOUS) ×2 IMPLANT
CLIP HMST 235XBRD CATH ROT (MISCELLANEOUS) IMPLANT
CLIP RESOLUTION 360 11X235 (MISCELLANEOUS)
FCP ESCP3.2XJMB 240X2.8X (MISCELLANEOUS) ×1
FORCEPS BIOP RAD 4 LRG CAP 4 (CUTTING FORCEPS) IMPLANT
FORCEPS BIOP RJ4 240 W/NDL (MISCELLANEOUS) ×1
FORCEPS ESCP3.2XJMB 240X2.8X (MISCELLANEOUS) ×1 IMPLANT
GOWN CVR UNV OPN BCK APRN NK (MISCELLANEOUS) ×2 IMPLANT
GOWN ISOL THUMB LOOP REG UNIV (MISCELLANEOUS) ×2
INJECTOR VARIJECT VIN23 (MISCELLANEOUS) IMPLANT
KIT DEFENDO VALVE AND CONN (KITS) IMPLANT
KIT ENDO PROCEDURE OLY (KITS) ×2 IMPLANT
MARKER SPOT ENDO TATTOO 5ML (MISCELLANEOUS) IMPLANT
PAD GROUND ADULT SPLIT (MISCELLANEOUS) IMPLANT
RETRIEVER NET PLAT FOOD (MISCELLANEOUS) IMPLANT
SNARE SHORT THROW 13M SML OVAL (MISCELLANEOUS) IMPLANT
SNARE SHORT THROW 30M LRG OVAL (MISCELLANEOUS) IMPLANT
SPOT EX ENDOSCOPIC TATTOO (MISCELLANEOUS)
SYR INFLATION 60ML (SYRINGE) IMPLANT
TRAP ETRAP POLY (MISCELLANEOUS) IMPLANT
VARIJECT INJECTOR VIN23 (MISCELLANEOUS)
WATER STERILE IRR 250ML POUR (IV SOLUTION) ×2 IMPLANT
WIRE CRE 18-20MM 8CM F G (MISCELLANEOUS) IMPLANT

## 2017-07-15 NOTE — Anesthesia Postprocedure Evaluation (Signed)
Anesthesia Post Note  Patient: Stacy Yang  Procedure(s) Performed: ESOPHAGOGASTRODUODENOSCOPY (EGD) WITH PROPOFOL (N/A )  Patient location during evaluation: PACU Anesthesia Type: General Level of consciousness: awake and alert Pain management: pain level controlled Vital Signs Assessment: post-procedure vital signs reviewed and stable Respiratory status: spontaneous breathing, nonlabored ventilation, respiratory function stable and patient connected to nasal cannula oxygen Cardiovascular status: blood pressure returned to baseline and stable Postop Assessment: no apparent nausea or vomiting Anesthetic complications: no    Jackob Crookston ELAINE

## 2017-07-15 NOTE — Transfer of Care (Signed)
Immediate Anesthesia Transfer of Care Note  Patient: Stacy Yang  Procedure(s) Performed: ESOPHAGOGASTRODUODENOSCOPY (EGD) WITH PROPOFOL (N/A )  Patient Location: PACU  Anesthesia Type: General  Level of Consciousness: awake, alert  and patient cooperative  Airway and Oxygen Therapy: Patient Spontanous Breathing and Patient connected to supplemental oxygen  Post-op Assessment: Post-op Vital signs reviewed, Patient's Cardiovascular Status Stable, Respiratory Function Stable, Patent Airway and No signs of Nausea or vomiting  Post-op Vital Signs: Reviewed and stable  Complications: No apparent anesthesia complications

## 2017-07-15 NOTE — Op Note (Signed)
[facility] Gastroenterology Patient Name: Stacy Yang Procedure Date: 07/15/2017 10:04 AM MRN: [id number] Account #: [id number] Date of Birth: 06-Dec-1980 Admit Type: Outpatient Age: 36 Room: [facility] OR ROOM 01 Gender: Female Note Status: Finalized Procedure:            Upper GI endoscopy Indications:          Dysphagia Providers:            Lin Landsman MD, MD Referring MD:         Angela Adam. Caryl Bis (Referring MD) Medicines:            Monitored Anesthesia Care Complications:        No immediate complications. Estimated blood loss: None. Procedure:            Pre-Anesthesia Assessment:                       - Prior to the procedure, a History and Physical was                        performed, and patient medications and allergies were                        reviewed. The patient is competent. The risks and                        benefits of the procedure and the sedation options and                        risks were discussed with the patient. All questions                        were answered and informed consent was obtained.                        Patient identification and proposed procedure were                        verified by the physician, the nurse, the                        anesthesiologist, the anesthetist and the technician in                        the pre-procedure area in the procedure room. Mental                        Status Examination: alert and oriented. Airway                        Examination: normal oropharyngeal airway and neck                        mobility. Respiratory Examination: clear to                        auscultation. CV Examination: normal. Prophylactic                        Antibiotics: The patient does not require prophylactic  antibiotics. Prior Anticoagulants: The patient has                        taken no previous anticoagulant or antiplatelet agents.                        ASA Grade  Assessment: II - A patient with mild systemic                        disease. After reviewing the risks and benefits, the                        patient was deemed in satisfactory condition to undergo                        the procedure. The anesthesia plan was to use monitored                        anesthesia care (MAC). Immediately prior to                        administration of medications, the patient was                        re-assessed for adequacy to receive sedatives. The                        heart rate, respiratory rate, oxygen saturations, blood                        pressure, adequacy of pulmonary ventilation, and                        response to care were monitored throughout the                        procedure. The physical status of the patient was                        re-assessed after the procedure.                       After obtaining informed consent, the endoscope was                        passed under direct vision. Throughout the procedure,                        the patient's blood pressure, pulse, and oxygen                        saturations were monitored continuously. The Olympus                        GIF-HQ190 Endoscope ([id number]) was introduced                        through the mouth, and advanced to the third part of  duodenum. The upper GI endoscopy was accomplished                        without difficulty. The patient tolerated the procedure                        well. Findings:      The duodenal bulb, second portion of the duodenum and third portion of       the duodenum were normal.      The entire examined stomach was normal.      The cardia and gastric fundus were normal on retroflexion.      The gastroesophageal junction and examined esophagus were normal.       Biopsies were obtained from the proximal and distal esophagus with cold       forceps for histology of suspected eosinophilic esophagitis.       Esophagogastric landmarks were identified: the gastroesophageal junction       was found at 36 cm from the incisors. Impression:           - Normal duodenal bulb, second portion of the duodenum                        and third portion of the duodenum.                       - Normal stomach.                       - Normal gastroesophageal junction and esophagus.                        Biopsied.                       - Esophagogastric landmarks identified. Recommendation:       - Discharge patient to home.                       - Resume previous diet today.                       - Continue present medications.                       - Await pathology results. Procedure Code(s):    --- Professional ---                       [phone], Esophagogastroduodenoscopy, flexible, transoral;                        with biopsy, single or multiple Diagnosis Code(s):    --- Professional ---                       R13.10, Dysphagia, unspecified CPT copyright 2016 American Medical Association. All rights reserved. The codes documented in this report are preliminary and upon coder review may  be revised to meet current compliance requirements. Dr. Ulyess Mort Lin Landsman MD, MD 07/15/2017 10:28:19 AM This report has been signed electronically. Number of Addenda: 0 Note Initiated On: 07/15/2017 10:04 AM      [facility]

## 2017-07-15 NOTE — H&P (Signed)
[name], MD [address]  [address]  [place], [place] [address]  Main: [phone]  Fax: [phone] Pager: [phone]  Primary Care Physician:  Glori LuisSonnenberg, Eric G, MD Primary Gastroenterologist:  Dr. [name]  Pre-Procedure History & Physical: HPI:  Stacy Yang is a 36 y.o. female is here for an endoscopy and colonoscopy.   Past Medical History:  Diagnosis Date  . Acne   . Asthma     Past Surgical History:  Procedure Laterality Date  . CESAREAN SECTION     x3  . TUBAL LIGATION      Prior to Admission medications   Medication Sig Start Date End Date Taking? Authorizing Provider  doxycycline (ADOXA) 100 MG tablet Take 100 mg 2 (two) times daily by mouth.   Yes [provider]  desipramine (NORPRAMIN) 25 MG tablet Take 1 tablet (25 mg total) at bedtime by mouth. 06/30/17 07/30/17  Toney ReilVanga, Rohini Reddy, MD  escitalopram (LEXAPRO) 10 MG tablet Take 1 tablet (10 mg total) by mouth daily. 05/29/17   Glori LuisSonnenberg, Eric G, MD  magnesium citrate (V-R MAGNESIUM CITRATE) SOLN Take by mouth.    [provider]  omeprazole (PRILOSEC) 40 MG capsule Take 1 capsule (40 mg total) daily by mouth. 06/26/17 07/26/17  Toney ReilVanga, Rohini Reddy, MD    Allergies as of 06/26/2017 - Review Complete 06/26/2017  Allergen Reaction Noted  . Iodinated diagnostic agents Hives 12/26/2014  . Iodine Swelling 11/04/2013  . Sulfur Hives 12/26/2014    Family History  Problem Relation Age of Onset  . Kidney disease Mother   . Alcohol abuse Father   . Diabetes Maternal Aunt   . Diabetes Maternal Grandmother   . Lung cancer Maternal Grandfather   . Cirrhosis Paternal Grandfather     Social History   Socioeconomic History  . Marital status: Divorced    Spouse name: Not on file  . Number of children: Not on file  . Years of education: Not on file  . Highest education level: Not on file  Social Needs  . Financial resource strain: Not on file  . Food insecurity  - worry: Not on file  . Food insecurity - inability: Not on file  . Transportation needs - medical: Not on file  . Transportation needs - non-medical: Not on file  Occupational History  . Not on file  Tobacco Use  . Smoking status: Never Smoker  . Smokeless tobacco: Never Used  Substance and Sexual Activity  . Alcohol use: No  . Drug use: No  . Sexual activity: Yes  Other Topics Concern  . Not on file  Social History Narrative  . Not on file    Review of Systems: See HPI, otherwise negative ROS  Physical Exam: Ht 5' (1.524 m)   Wt 195 lb (88.5 kg)   BMI 38.08 kg/m  General:   Alert,  pleasant and cooperative in NAD Head:  Normocephalic and atraumatic. Neck:  Supple; no masses or thyromegaly. Lungs:  Clear throughout to auscultation.    Heart:  Regular rate and rhythm. Abdomen:  Soft, nontender and nondistended. Normal bowel sounds, without guarding, and without rebound.   Neurologic:  Alert and  oriented x4;  grossly normal neurologically.  Impression/Plan: Stacy Yang is here for an endoscopy and colonoscopy to be performed for dysphagia  Risks, benefits, limitations, and alternatives regarding  endoscopy have been reviewed with the patient.  Questions have been answered.  All parties agreeable.   [name], MD  07/15/2017, 8:39 AM

## 2017-07-15 NOTE — Anesthesia Preprocedure Evaluation (Signed)
Anesthesia Evaluation  Patient identified by MRN, date of birth, ID band Patient awake    Reviewed: Allergy & Precautions, H&P , NPO status , Patient's Chart, lab work & pertinent test results, reviewed documented beta blocker date and time   Airway Mallampati: II  TM Distance: >3 FB Neck ROM: full    Dental no notable dental hx.    Pulmonary asthma ,    Pulmonary exam normal breath sounds clear to auscultation       Cardiovascular Exercise Tolerance: Good negative cardio ROS   Rhythm:regular Rate:Normal     Neuro/Psych negative neurological ROS  negative psych ROS   GI/Hepatic negative GI ROS, Neg liver ROS,   Endo/Other  negative endocrine ROS  Renal/GU negative Renal ROS  negative genitourinary   Musculoskeletal   Abdominal   Peds  Hematology negative hematology ROS (+)   Anesthesia Other Findings BMI 38  Reproductive/Obstetrics negative OB ROS                            Anesthesia Physical Anesthesia Plan  ASA: II  Anesthesia Plan: General   Post-op Pain Management:    Induction:   PONV Risk Score and Plan:   Airway Management Planned:   Additional Equipment:   Intra-op Plan:   Post-operative Plan:   Informed Consent: I have reviewed the patients History and Physical, chart, labs and discussed the procedure including the risks, benefits and alternatives for the proposed anesthesia with the patient or authorized representative who has indicated his/her understanding and acceptance.   Dental Advisory Given  Plan Discussed with: CRNA  Anesthesia Plan Comments:         Anesthesia Quick Evaluation

## 2017-07-15 NOTE — Anesthesia Procedure Notes (Signed)
Procedure Name: MAC Date/Time: 07/15/2017 10:12 AM Performed by: Janna Arch, CRNA Pre-anesthesia Checklist: Patient identified, Emergency Drugs available, Suction available and Patient being monitored Patient Re-evaluated:Patient Re-evaluated prior to induction Oxygen Delivery Method: Nasal cannula

## 2017-07-16 ENCOUNTER — Encounter: Payer: Self-pay | Admitting: Gastroenterology

## 2017-07-17 ENCOUNTER — Encounter: Payer: Self-pay | Admitting: Gastroenterology

## 2017-07-23 ENCOUNTER — Other Ambulatory Visit: Payer: Self-pay | Admitting: Gastroenterology

## 2017-07-23 DIAGNOSIS — R1310 Dysphagia, unspecified: Secondary | ICD-10-CM

## 2017-07-24 ENCOUNTER — Ambulatory Visit: Payer: Federal, State, Local not specified - PPO | Admitting: Gastroenterology

## 2017-07-30 ENCOUNTER — Telehealth: Payer: Self-pay

## 2017-07-30 NOTE — Telephone Encounter (Signed)
Received fax from [employer] about potential effect on QT interval with amitriptyline and escitalopram. Called to verify if patient is taking both of these. vm full unable to leave message. Ok for pec to speak with patient

## 2017-07-31 ENCOUNTER — Ambulatory Visit (INDEPENDENT_AMBULATORY_CARE_PROVIDER_SITE_OTHER): Payer: Federal, State, Local not specified - PPO | Admitting: Family Medicine

## 2017-07-31 ENCOUNTER — Encounter: Payer: Self-pay | Admitting: Family Medicine

## 2017-07-31 ENCOUNTER — Other Ambulatory Visit: Payer: Self-pay

## 2017-07-31 DIAGNOSIS — E669 Obesity, unspecified: Secondary | ICD-10-CM

## 2017-07-31 DIAGNOSIS — R1310 Dysphagia, unspecified: Secondary | ICD-10-CM

## 2017-07-31 DIAGNOSIS — F32A Depression, unspecified: Secondary | ICD-10-CM

## 2017-07-31 DIAGNOSIS — F419 Anxiety disorder, unspecified: Secondary | ICD-10-CM | POA: Diagnosis not present

## 2017-07-31 DIAGNOSIS — F329 Major depressive disorder, single episode, unspecified: Secondary | ICD-10-CM

## 2017-07-31 NOTE — Assessment & Plan Note (Addendum)
Has been evaluated by GI.  Likely related to reflux.  She is going to start on omeprazole.  She follows up with GI in 1 month.  I reinforced that she needs to keep this particularly given that she has some mild bleeding at times with bowel movements.  It appears she did discuss this with Dr. Allegra LaiVanga per the note.  We will have her complete stool cards.  Patient declined rectal exam.  The bleeding has been a chronic intermittent issue likely related to hemorrhoids.

## 2017-07-31 NOTE — Patient Instructions (Signed)
Nice to see you. Please work on diet and exercise as discussed.  I have included dietary information below. Please start on the omeprazole.  You need to follow-up with GI as scheduled. If your anxiety and depression worsens please let us know.  Diet Recommendations  Starchy (carb) foods: Bread, rice, pasta, potatoes, corn, cereal, grits, crackers, bagels, muffins, all baked goods.  (Fruits, milk, and yogurt also have carbohydrate, but most of these foods will not spike your blood sugar as the starchy foods will.)  A few fruits do cause high blood sugars; use small portions of bananas (limit to 1/2 at a time), grapes, watermelon, oranges, and most tropical fruits.    Protein foods: Meat, fish, poultry, eggs, dairy foods, and beans such as pinto and kidney beans (beans also provide carbohydrate).   1. Eat at least 3 meals and 1-2 snacks per day. Never go more than 4-5 hours while awake without eating. Eat breakfast within the first hour of getting up.   2. Limit starchy foods to TWO per meal and ONE per snack. ONE portion of a starchy  food is equal to the following:   - ONE slice of bread (or its equivalent, such as half of a hamburger bun).   - 1/2 cup of a "scoopable" starchy food such as potatoes or rice.   - 15 grams of carbohydrate as shown on food label.  3. Include at every meal: a protein food, a carb food, and vegetables and/or fruit.   - Obtain twice the volume of veg's as protein or carbohydrate foods for both lunch and dinner.   - Fresh or frozen veg's are best.   - Keep frozen veg's on hand for a quick vegetable serving.

## 2017-07-31 NOTE — Assessment & Plan Note (Signed)
Quite a bit improved with change in location.  Not currently on medication.  She does not want treatment at this time.  She will continue to monitor.

## 2017-07-31 NOTE — Progress Notes (Signed)
  Marikay AlarEric Sonnenberg, MD Phone: [phone]  Stacy Yang is a 36 y.o. female who presents today for follow-up.  Obesity: Patient has not been exercising recently.  She does eat a lot of bread.  Tries to eat salads with no dressing.  Also eats fruit and protein shakes.  Previously was doing a boot camp and low-carb diet and lost a fair amount of weight.  GERD: She underwent EGD through GI.  Found to have reflux changes.  She has not started on omeprazole.  Feels like her throat is thick at times and has some trouble swallowing.  Rare regurgitation.  No burning.  She reports a colonoscopy about 10 years ago.  She rarely has blood in her stool.  Has been told it was related to a hemorrhoid previously.  Anxiety/depression: Notes this has improved quite a bit since she has moved residences.  She has also cut off ties with her ex-husband completely at this time.  Lexapro gave her headaches so she discontinued it and the headaches went away.  No SI.  Social History   Tobacco Use  Smoking Status Never Smoker  Smokeless Tobacco Never Used     ROS see history of present illness  Objective  Physical Exam Vitals:   07/31/17 1014  BP: 100/62  Pulse: 80  Temp: 98.6 F (37 C)  SpO2: 98%    BP Readings from Last 3 Encounters:  07/31/17 100/62  07/15/17 122/76  06/26/17 126/84   Wt Readings from Last 3 Encounters:  07/31/17 197 lb (89.4 kg)  07/15/17 195 lb (88.5 kg)  06/26/17 196 lb (88.9 kg)    Physical Exam  Constitutional: No distress.  Cardiovascular: Normal rate, regular rhythm and normal heart sounds.  Pulmonary/Chest: Effort normal and breath sounds normal. No respiratory distress. She has no wheezes. She has no rales.  Neurological: She is alert. Gait normal.  Skin: She is not diaphoretic.  Psychiatric: Mood and affect normal.  Patient declined rectal exam   Assessment/Plan: Please see individual problem list.  Dysphagia Has been evaluated by GI.  Likely related  to reflux.  She is going to start on omeprazole.  She follows up with GI in 1 month.  I reinforced that she needs to keep this particularly given that she has some mild bleeding at times with bowel movements.  It appears she did discuss this with Dr. Allegra LaiVanga per the note.  We will have her complete stool cards.  Patient declined rectal exam.  The bleeding has been a chronic intermittent issue likely related to hemorrhoids.  Anxiety and depression Quite a bit improved with change in location.  Not currently on medication.  She does not want treatment at this time.  She will continue to monitor.  Obesity (BMI 35.0-39.9 without comorbidity) Discussed diet and exercise at length.  Recheck in 3 months.   Trula OreChristina was seen today for follow-up.  Diagnoses and all orders for this visit:  Dysphagia, unspecified type  Anxiety and depression  Obesity (BMI 35.0-39.9 without comorbidity)    No orders of the defined types were placed in this encounter.   No orders of the defined types were placed in this encounter.    Marikay AlarEric Sonnenberg, MD [facility] [place]

## 2017-07-31 NOTE — Telephone Encounter (Signed)
Patient is scheduled today.

## 2017-07-31 NOTE — Assessment & Plan Note (Signed)
Discussed diet and exercise at length.  Recheck in 3 months.

## 2017-09-07 ENCOUNTER — Telehealth: Payer: Self-pay | Admitting: Gastroenterology

## 2017-09-07 ENCOUNTER — Other Ambulatory Visit: Payer: Self-pay

## 2017-09-07 ENCOUNTER — Ambulatory Visit: Payer: Federal, State, Local not specified - PPO | Admitting: Gastroenterology

## 2017-09-07 NOTE — Telephone Encounter (Signed)
PATIENT CALLED & L/M  ON ANSWERING MACHINE NEEDING TO CANCEL TODAY'S APPOINTMENT DUE TO A SICK CHILD. I HAVE MADE MULTIPLE ATTEMPTS TO REACH PATIENT,BUT THE PHONE SAYS ALL CIRCUITS ARE BUSY.

## 2017-09-18 DIAGNOSIS — K080 Exfoliation of teeth due to systemic causes: Secondary | ICD-10-CM | POA: Diagnosis not present

## 2017-10-30 ENCOUNTER — Ambulatory Visit: Payer: Federal, State, Local not specified - PPO | Admitting: Family Medicine

## 2017-10-31 ENCOUNTER — Other Ambulatory Visit: Payer: Self-pay | Admitting: Family Medicine

## 2017-11-02 NOTE — Telephone Encounter (Signed)
Last OV 07/31/17 filed under historical

## 2017-11-18 ENCOUNTER — Ambulatory Visit: Payer: Self-pay

## 2017-11-18 NOTE — Telephone Encounter (Signed)
Patient called in with c/o "cough and wheezing."  She says "it started about 2 weeks ago and I have been treating it at home with OTC cough medicine and tessalon pearles. I never had a fever, considering my kids had the flu around the same time and I was out with them. I have been using my inhaler and it's not working for the cough. The cough is to the point now it's hard to breathe when I'm walking or even lying down at night." She denies runny nose, but reports having wheezing and chest pain with coughing. According to protocol, see PCP within 24 hours, patient states "I don't have any more time available to take off at work, so I will have to take a Friday appointment, since I'm off."  Appointment scheduled for Friday, 11/20/17 at 0830 with Dr. Birdie SonsSonnenberg, care advice given, patient verbalized understanding.   Reason for Disposition . [1] Continuous (nonstop) coughing interferes with work or school AND [2] no improvement using cough treatment per protocol  Answer Assessment - Initial Assessment Questions 1. ONSET: "When did the cough begin?"      About 2 weeks ago 2. SEVERITY: "How bad is the cough today?"      Coughing every few seconds 3. RESPIRATORY DISTRESS: "Describe your breathing."      Hard to breathe when walking, when lying down, wheezing 4. FEVER: "Do you have a fever?" If so, ask: "What is your temperature, how was it measured, and when did it start?"     No 5. HEMOPTYSIS: "Are you coughing up any blood?" If so ask: "How much?" (flecks, streaks, tablespoons, etc.)     Not coughing up anything 6. TREATMENT: "What have you done so far to treat the cough?" (e.g., meds, fluids, humidifier)     Inhaler, fluids, OTC cough medicine, tessalon pearles 7. CARDIAC HISTORY: "Do you have any history of heart disease?" (e.g., heart attack, congestive heart failure)      Cardiomyopathy 18 years ago 8. LUNG HISTORY: "Do you have any history of lung disease?"  (e.g., pulmonary embolus, asthma,  emphysema)     Asthma 9. PE RISK FACTORS: "Do you have a history of blood clots?" (or: recent major surgery, recent prolonged travel, bedridden )     No 10. OTHER SYMPTOMS: "Do you have any other symptoms? (e.g., runny nose, wheezing, chest pain)      Wheezing, chest pain with coughing, hard to breathe with exertion 11. PREGNANCY: "Is there any chance you are pregnant?" "When was your last menstrual period?"       No; LMP 3 weeks ago 12. TRAVEL: "Have you traveled out of the country in the last month?" (e.g., travel history, exposures)       No  Protocols used: COUGH - ACUTE NON-PRODUCTIVE-A-AH

## 2017-11-20 ENCOUNTER — Encounter: Payer: Self-pay | Admitting: Family Medicine

## 2017-11-20 ENCOUNTER — Ambulatory Visit (INDEPENDENT_AMBULATORY_CARE_PROVIDER_SITE_OTHER): Payer: Federal, State, Local not specified - PPO

## 2017-11-20 ENCOUNTER — Ambulatory Visit: Payer: Federal, State, Local not specified - PPO | Admitting: Family Medicine

## 2017-11-20 ENCOUNTER — Other Ambulatory Visit: Payer: Self-pay

## 2017-11-20 VITALS — BP 116/62 | HR 89 | Temp 99.0°F | Wt 192.4 lb

## 2017-11-20 DIAGNOSIS — R059 Cough, unspecified: Secondary | ICD-10-CM

## 2017-11-20 DIAGNOSIS — R05 Cough: Secondary | ICD-10-CM

## 2017-11-20 DIAGNOSIS — J4521 Mild intermittent asthma with (acute) exacerbation: Secondary | ICD-10-CM | POA: Diagnosis not present

## 2017-11-20 DIAGNOSIS — J45901 Unspecified asthma with (acute) exacerbation: Secondary | ICD-10-CM | POA: Insufficient documentation

## 2017-11-20 DIAGNOSIS — R0989 Other specified symptoms and signs involving the circulatory and respiratory systems: Secondary | ICD-10-CM | POA: Diagnosis not present

## 2017-11-20 MED ORDER — PREDNISONE 20 MG PO TABS: 40.0000 mg | ORAL_TABLET | Freq: Every day | ORAL | 0 refills | Status: DC

## 2017-11-20 MED ORDER — ALBUTEROL SULFATE HFA 108 (90 BASE) MCG/ACT IN AERS: 2.0000 | INHALATION_SPRAY | Freq: Four times a day (QID) | RESPIRATORY_TRACT | 2 refills | Status: DC | PRN

## 2017-11-20 MED ORDER — ALBUTEROL SULFATE (2.5 MG/3ML) 0.083% IN NEBU
2.5000 mg | INHALATION_SOLUTION | Freq: Once | RESPIRATORY_TRACT | Status: AC
  Administered 2017-11-20: 2.5 mg via RESPIRATORY_TRACT

## 2017-11-20 NOTE — Progress Notes (Signed)
Marikay AlarEric Rooney Gladwin, MD Phone: [phone]  Stacy MemoryChristina M Yang is a 37 y.o. female who presents today for same day visit.  Patient reports for the last couple of weeks she has had nonproductive cough and wheezing.  Has had some dyspnea.  Notes she has been propping herself up on pillows to help her sleep better.  Notes some right sided thoracic back pain that started about a week ago.  Feels like a bruise.  Nothing irritates it.  Notes it is a dull ache.  She does note some tightness associated with the shortness of breath.  It is not exertional.  Nothing precipitates it or exacerbates it.  It is not a sharp pain.  No edema.  Notes her symptoms got worse with the pollen coming out a week ago.  She has been using an inhaler at home which has provided some benefit.  No fevers.  No upper respiratory symptoms.  Her children did have the flu 2-3 weeks ago.  This is similar to prior asthma exacerbations.   Social History   Tobacco Use  Smoking Status Never Smoker  Smokeless Tobacco Never Used     ROS see history of present illness  Objective  Physical Exam Vitals:   11/20/17 0829  BP: 116/62  Pulse: 89  Temp: 99 F (37.2 C)  SpO2: 98%    BP Readings from Last 3 Encounters:  11/20/17 116/62  07/31/17 100/62  07/15/17 122/76   Wt Readings from Last 3 Encounters:  11/20/17 192 lb 6.4 oz (87.3 kg)  07/31/17 197 lb (89.4 kg)  07/15/17 195 lb (88.5 kg)    Physical Exam  Constitutional: No distress.  Cardiovascular: Normal rate, regular rhythm and normal heart sounds.  Pulmonary/Chest: Effort normal. No respiratory distress. She has no wheezes.  Slight scattered coarseness on expiration  Musculoskeletal: She exhibits no edema.       Arms: Neurological: She is alert. Gait normal.  Skin: Skin is warm and dry. She is not diaphoretic.     Assessment/Plan: Please see individual problem list.  Asthma exacerbation Symptoms and history most consistent with asthma exacerbation.   Suspect muscular strain causing the discomfort in her back.  She was given a nebulizer treatment in the office.  Lungs sounded more clear and she is moving better air after this.  She feels improved.  We will proceed with a chest x-ray to rule out underlying bronchitis or pneumonia as she reports a history of those things.  We will proceed with treatment with prednisone and albuterol.  Given return precautions.   Orders Placed This Encounter  Procedures  . DG Chest 2 View    Standing Status:   Future    Number of Occurrences:   1    Standing Expiration Date:   01/21/2019    Order Specific Question:   Reason for Exam (SYMPTOM  OR DIAGNOSIS REQUIRED)    Answer:   cough, coarse breath sounds, dyspnea, 2 weeks, history of asthma    Order Specific Question:   Is patient pregnant?    Answer:   No    Order Specific Question:   Preferred imaging location?    Answer:   [facility]    Order Specific Question:   Radiology Contrast Protocol - do NOT remove file path    Answer:   [email or web address]    Meds ordered this encounter  Medications  . predniSONE (DELTASONE) 20 MG tablet    Sig: Take 2 tablets (40 mg total) by mouth daily with  breakfast.    Dispense:  10 tablet    Refill:  0  . albuterol (PROVENTIL HFA;VENTOLIN HFA) 108 (90 Base) MCG/ACT inhaler    Sig: Inhale 2 puffs into the lungs every 6 (six) hours as needed for wheezing or shortness of breath.    Dispense:  1 Inhaler    Refill:  2  . albuterol (PROVENTIL) (2.5 MG/3ML) 0.083% nebulizer solution 2.5 mg     [name], MD [facility]

## 2017-11-20 NOTE — Patient Instructions (Signed)
Nice to see you. Please take the prednisone as prescribed. You can use the albuterol inhaler every 6 hours for the next 2 days and then every 6 hours as needed. We will contact you with the chest x-ray results. If you develop fevers, cough productive of blood, worsening trouble breathing, or any new or changing symptoms please seek medical attention immediately.

## 2017-11-20 NOTE — Assessment & Plan Note (Addendum)
Symptoms and history most consistent with asthma exacerbation.  Suspect muscular strain causing the discomfort in her back.  She was given a nebulizer treatment in the office.  Lungs sounded more clear and she is moving better air after this.  She feels improved.  We will proceed with a chest x-ray to rule out underlying bronchitis or pneumonia as she reports a history of those things.  We will proceed with treatment with prednisone and albuterol.  Given return precautions.

## 2017-11-20 NOTE — Addendum Note (Signed)
Addended by: Glori LuisSONNENBERG, ERIC G on: 11/20/2017 09:56 AM   Modules accepted: Orders

## 2018-04-30 DIAGNOSIS — Z113 Encounter for screening for infections with a predominantly sexual mode of transmission: Secondary | ICD-10-CM | POA: Diagnosis not present

## 2018-04-30 DIAGNOSIS — Z01419 Encounter for gynecological examination (general) (routine) without abnormal findings: Secondary | ICD-10-CM | POA: Diagnosis not present

## 2018-04-30 DIAGNOSIS — Z3200 Encounter for pregnancy test, result unknown: Secondary | ICD-10-CM | POA: Diagnosis not present

## 2018-06-15 DIAGNOSIS — R0602 Shortness of breath: Secondary | ICD-10-CM | POA: Diagnosis not present

## 2018-06-15 DIAGNOSIS — J45909 Unspecified asthma, uncomplicated: Secondary | ICD-10-CM | POA: Diagnosis not present

## 2018-06-15 DIAGNOSIS — Z9109 Other allergy status, other than to drugs and biological substances: Secondary | ICD-10-CM | POA: Diagnosis not present

## 2018-06-15 DIAGNOSIS — Z8701 Personal history of pneumonia (recurrent): Secondary | ICD-10-CM | POA: Diagnosis not present

## 2018-06-15 DIAGNOSIS — R05 Cough: Secondary | ICD-10-CM | POA: Diagnosis not present

## 2018-06-15 DIAGNOSIS — R079 Chest pain, unspecified: Secondary | ICD-10-CM | POA: Diagnosis not present

## 2018-06-15 DIAGNOSIS — Z882 Allergy status to sulfonamides status: Secondary | ICD-10-CM | POA: Diagnosis not present

## 2018-06-15 DIAGNOSIS — R0789 Other chest pain: Secondary | ICD-10-CM | POA: Diagnosis not present

## 2018-06-15 DIAGNOSIS — I429 Cardiomyopathy, unspecified: Secondary | ICD-10-CM | POA: Diagnosis not present

## 2018-06-15 DIAGNOSIS — J3489 Other specified disorders of nose and nasal sinuses: Secondary | ICD-10-CM | POA: Diagnosis not present

## 2018-07-23 ENCOUNTER — Ambulatory Visit: Payer: Federal, State, Local not specified - PPO | Admitting: Pulmonary Disease

## 2018-07-23 ENCOUNTER — Encounter: Payer: Self-pay | Admitting: Pulmonary Disease

## 2018-07-23 VITALS — BP 104/70 | HR 86 | Ht 60.0 in | Wt 191.6 lb

## 2018-07-23 DIAGNOSIS — R0609 Other forms of dyspnea: Secondary | ICD-10-CM | POA: Diagnosis not present

## 2018-07-23 DIAGNOSIS — G4733 Obstructive sleep apnea (adult) (pediatric): Secondary | ICD-10-CM | POA: Diagnosis not present

## 2018-07-23 DIAGNOSIS — E668 Other obesity: Secondary | ICD-10-CM | POA: Diagnosis not present

## 2018-07-23 DIAGNOSIS — G4710 Hypersomnia, unspecified: Secondary | ICD-10-CM

## 2018-07-23 DIAGNOSIS — R05 Cough: Secondary | ICD-10-CM | POA: Diagnosis not present

## 2018-07-23 DIAGNOSIS — G4719 Other hypersomnia: Secondary | ICD-10-CM

## 2018-07-23 DIAGNOSIS — R059 Cough, unspecified: Secondary | ICD-10-CM

## 2018-07-23 DIAGNOSIS — J4540 Moderate persistent asthma, uncomplicated: Secondary | ICD-10-CM

## 2018-07-23 DIAGNOSIS — R0683 Snoring: Secondary | ICD-10-CM

## 2018-07-23 DIAGNOSIS — E669 Obesity, unspecified: Secondary | ICD-10-CM

## 2018-07-23 MED ORDER — BUDESONIDE-FORMOTEROL FUMARATE 80-4.5 MCG/ACT IN AERO: 2.0000 | INHALATION_SPRAY | Freq: Two times a day (BID) | RESPIRATORY_TRACT | 0 refills | Status: DC

## 2018-07-23 MED ORDER — FLUTICASONE-SALMETEROL 113-14 MCG/ACT IN AEPB: 1.0000 | INHALATION_SPRAY | Freq: Two times a day (BID) | RESPIRATORY_TRACT | 10 refills | Status: DC

## 2018-07-23 NOTE — Patient Instructions (Signed)
Begin AirDuo inhaler, 1 actuation twice a day.  Rinse mouth after use  Continue albuterol inhaler as needed for chest tightness, wheezing, cough, increased shortness of breath  Sleep study ordered  Follow-up in 6-8 weeks.  Call sooner if needed

## 2018-07-26 ENCOUNTER — Encounter: Payer: Self-pay | Admitting: Pulmonary Disease

## 2018-07-26 NOTE — Progress Notes (Signed)
PULMONARY CONSULT NOTE  Requesting MD/Service: Self Date of initial consultation: 07/23/2018 Reason for consultation: Asthma  PT PROFILE: 37 y.o. female never smoker diagnosed with asthma as a child self-referred for evaluation of persistent asthma symptoms and exertional dyspnea.  DATA: 02/01/18 PSG: Overall AHI 1.8/hour.  Supine position AHI 43/hour  INTERVAL:  HPI:  She states that she has had asthma "my whole life".  She has been treated since childhood.  She reports exertional dyspnea and frequent cough.  She states that over the past couple of years she has felt that she is constantly struggling for breath and constantly coughing.  Her cough is nonproductive but often disturbs those around her.  She is been diagnosed with bronchitis on many occasions over her adult lifetime.  She is on no controller asthma medications.  She presently uses her albuterol rescue inhaler approximately 4 times per week with modest relief of the above symptoms.  She also reports a 40 pound weight gain in the past 4 years.  She reports snoring, daytime hypersomnolence, morning headache.  Past Medical History:  Diagnosis Date  . Acne   . Asthma     Past Surgical History:  Procedure Laterality Date  . CESAREAN SECTION     x3  . ESOPHAGOGASTRODUODENOSCOPY (EGD) WITH PROPOFOL N/A 07/15/2017   Procedure: ESOPHAGOGASTRODUODENOSCOPY (EGD) WITH PROPOFOL;  Surgeon: Toney ReilVanga, Rohini Reddy, MD;  [facility];  Service: Endoscopy;  Laterality: N/A;  . TUBAL LIGATION      MEDICATIONS: I have reviewed all medications and confirmed regimen as documented  Social History   Socioeconomic History  . Marital status: Divorced    Spouse name: Not on file  . Number of children: Not on file  . Years of education: Not on file  . Highest education level: Not on file  Occupational History  . Not on file  Social Needs  . Financial resource strain: Not on file  . Food insecurity:    Worry: Not on file     Inability: Not on file  . Transportation needs:    Medical: Not on file    Non-medical: Not on file  Tobacco Use  . Smoking status: Never Smoker  . Smokeless tobacco: Never Used  Substance and Sexual Activity  . Alcohol use: No  . Drug use: No  . Sexual activity: Yes  Lifestyle  . Physical activity:    Days per week: Not on file    Minutes per session: Not on file  . Stress: Not on file  Relationships  . Social connections:    Talks on phone: Not on file    Gets together: Not on file    Attends religious service: Not on file    Active member of club or organization: Not on file    Attends meetings of clubs or organizations: Not on file    Relationship status: Not on file  . Intimate partner violence:    Fear of current or ex partner: Not on file    Emotionally abused: Not on file    Physically abused: Not on file    Forced sexual activity: Not on file  Other Topics Concern  . Not on file  Social History Narrative  . Not on file    Family History  Problem Relation Age of Onset  . Kidney disease Mother   . Alcohol abuse Father   . Diabetes Maternal Aunt   . Diabetes Maternal Grandmother   . Lung cancer Maternal Grandfather   . Cirrhosis  Paternal Grandfather     ROS: No fever, myalgias/arthralgias, unexplained weight loss or weight gain No new focal weakness or sensory deficits No otalgia, hearing loss, visual changes, nasal and sinus symptoms, mouth and throat problems No neck pain or adenopathy No abdominal pain, N/V/D, diarrhea, change in bowel pattern No dysuria, change in urinary pattern   Vitals:   07/23/18 0838 07/23/18 0841  BP:  104/70  Pulse:  86  SpO2:  98%  Weight: 191 lb 9.6 oz (86.9 kg)   Height: 5' (1.524 m)      EXAM:  Gen: Obese, no overt respiratory distress HEENT: NCAT, sclera white, oropharynx normal Neck: Supple without LAN, thyromegaly, JVD Lungs: breath sounds full, percussion normal, adventitious sounds:  None Cardiovascular: RRR, no murmurs noted Abdomen: Soft, nontender, normal BS Ext: without clubbing, cyanosis, edema Neuro: CNs grossly intact, motor and sensory intact Skin: Limited exam, no lesions noted  DATA:   BMP Latest Ref Rng & Units 05/07/2016 12/26/2014 04/01/2014  Glucose 65 - 99 mg/dL 161(W) 960(A) 540(J)  BUN 6 - 20 mg/dL 15 13 15   Creatinine 0.44 - 1.00 mg/dL 8.11 9.14 7.82  Sodium 135 - 145 mmol/L 138 139 142  Potassium 3.5 - 5.1 mmol/L 3.9 3.4(L) 3.8  Chloride 101 - 111 mmol/L 102 106 105  CO2 22 - 32 mmol/L 30 27 28   Calcium 8.9 - 10.3 mg/dL 9.7 9.2 8.5    CBC Latest Ref Rng & Units 05/07/2016 12/26/2014 04/01/2014  WBC 3.6 - 11.0 K/uL 11.0 9.9 10.7  Hemoglobin 12.0 - 16.0 g/dL 95.6 21.3 08.6  Hematocrit 35.0 - 47.0 % 40.5 43.2 40.3  Platelets 150 - 440 K/uL 212 195 195    CXR:    I have personally reviewed all chest radiographs reported above including CXRs and CT chest unless otherwise indicated  IMPRESSION:     ICD-10-CM   1. Moderate obesity E66.8   2. DOE (dyspnea on exertion) R06.09   3. Cough R05   4. Moderate persistent asthma without complication J45.40   5. Daytime hypersomnia G47.19 Home sleep test  6. Snoring R06.83 Home sleep test   Chronic cough and exertional dyspnea are possibly attributable to moderate persistent asthma.  Obesity is likely contributing to dyspnea on exertion as well.  Her history (snoring, daytime hypersomnolence) strongly suggests obstructive sleep apnea.  I am concerned about the findings on polysomnogram in 2016 which demonstrated moderate to severe OSA when in a supine position.  PLAN:  AirDuo inhaler, 1 actuation twice a day.  Rinse mouth after use Continue albuterol inhaler as needed for chest tightness, wheezing, cough, shortness of breath Repeat polysomnogram ordered.  Will be performed as a home study. Follow-up in 6-8 weeks.  Call sooner if needed   Billy Fischer, MD PCCM service Mobile [phone] Pager  [phone] 07/26/2018 1:15 PM

## 2018-07-27 DIAGNOSIS — G4733 Obstructive sleep apnea (adult) (pediatric): Secondary | ICD-10-CM | POA: Diagnosis not present

## 2018-07-29 ENCOUNTER — Telehealth: Payer: Self-pay | Admitting: Pulmonary Disease

## 2018-07-29 DIAGNOSIS — G4710 Hypersomnia, unspecified: Secondary | ICD-10-CM

## 2018-07-29 DIAGNOSIS — R0683 Snoring: Secondary | ICD-10-CM

## 2018-07-29 DIAGNOSIS — G4719 Other hypersomnia: Secondary | ICD-10-CM

## 2018-07-29 DIAGNOSIS — G4733 Obstructive sleep apnea (adult) (pediatric): Secondary | ICD-10-CM

## 2018-07-29 NOTE — Telephone Encounter (Signed)
Please call with home sleep study results. Pt states it is ok to leave a detailed message.

## 2018-07-30 NOTE — Telephone Encounter (Signed)
Left detailed message.   

## 2018-07-30 NOTE — Telephone Encounter (Signed)
Left message to call back  

## 2018-07-30 NOTE — Telephone Encounter (Signed)
Pt called back and left message to proceed with placing orders for cpap. Orders placed. Nothing further needed

## 2018-08-12 DIAGNOSIS — G4733 Obstructive sleep apnea (adult) (pediatric): Secondary | ICD-10-CM | POA: Diagnosis not present

## 2018-08-18 DIAGNOSIS — G4733 Obstructive sleep apnea (adult) (pediatric): Secondary | ICD-10-CM | POA: Diagnosis not present

## 2018-08-31 NOTE — Addendum Note (Signed)
Addended by: Maxwell Marion A on: 08/31/2018 02:08 PM   Modules accepted: Orders

## 2018-09-02 ENCOUNTER — Telehealth: Payer: Self-pay

## 2018-09-02 NOTE — Telephone Encounter (Signed)
LM for patient to call to r/s apt, she has not been on CPAP long enough for first compliance visit. Must be after 09/13/2018.

## 2018-09-03 ENCOUNTER — Ambulatory Visit (INDEPENDENT_AMBULATORY_CARE_PROVIDER_SITE_OTHER): Payer: Federal, State, Local not specified - PPO | Admitting: Pulmonary Disease

## 2018-09-03 ENCOUNTER — Encounter: Payer: Self-pay | Admitting: Pulmonary Disease

## 2018-09-03 VITALS — BP 118/74 | HR 74 | Ht 60.0 in | Wt 191.8 lb

## 2018-09-03 DIAGNOSIS — G4733 Obstructive sleep apnea (adult) (pediatric): Secondary | ICD-10-CM | POA: Diagnosis not present

## 2018-09-03 DIAGNOSIS — J4540 Moderate persistent asthma, uncomplicated: Secondary | ICD-10-CM | POA: Diagnosis not present

## 2018-09-03 NOTE — Progress Notes (Signed)
PULMONARY OFFICE FOLLOW UP NOTE  Requesting MD/Service: Self Date of initial consultation: 07/23/2018 Reason for consultation: Asthma  PT PROFILE: 38 y.o. female never smoker diagnosed with asthma as a child self-referred for evaluation of persistent asthma symptoms and exertional dyspnea.  DATA: 02/01/18 PSG: Overall AHI 1.8/hour.  Supine position AHI 43/hour 07/25/18 Home PSG: AHI 14/hr. Autoset CPAP recommended 5-20 cm H2O  INTERVAL: Initial consultation 07/23/2018.  No major pulmonary events since then  SUBJ: This is a scheduled follow-up.  Since last visit, repeat sleep study reveals mild obstructive sleep apnea with AHI 14/hour.  On the basis of this, she was started on AutoSet CPAP 5-20 cm H2O.  She has been using this for approximately the past 20 days.  She believes it has been very beneficial.  She has a nasal mask.  Sometimes she takes it off in the middle of the night unknowingly.  Nonetheless, she feels improvement in daytime sleepiness and morning headache.  With regard to asthma, last visit we initiated ICS/LABA controller medication.  This was significantly beneficial to her with improvement in cough, dyspnea and reduction in albuterol use.  However, over the past week, she has not been using it because it has been an extremely busy week for her and she misplaced her inhaler.  Over the past week she has noticed a relapse in the above symptoms and is using her albuterol more.  She denies CP, fever, purulent sputum, hemoptysis, LE edema and calf tenderness.   Vitals:   09/03/18 0854 09/03/18 0857  BP:  118/74  Pulse:  74  SpO2:  98%  Weight: 191 lb 12.8 oz (87 kg)   Height: 5' (1.524 m)   RA  EXAM:  Gen: Moderately obese, NAD HEENT: NCAT, sclerae white, oropharynx normal Neck: No LAN, no JVD noted Lungs: full BS, no wheezes, no adventitious sounds Cardiovascular: Regular, normal rate, no M noted Abdomen: Soft, NT, +BS Ext: no C/C/E Neuro: PERRL, EOMI,  motor/sensory grossly intact Skin: No lesions noted   DATA:   BMP Latest Ref Rng & Units 05/07/2016 12/26/2014 04/01/2014  Glucose 65 - 99 mg/dL 409(W117(H) 119(J111(H) 478(G111(H)  BUN 6 - 20 mg/dL 15 13 15   Creatinine 0.44 - 1.00 mg/dL 9.560.85 2.130.64 0.860.95  Sodium 135 - 145 mmol/L 138 139 142  Potassium 3.5 - 5.1 mmol/L 3.9 3.4(L) 3.8  Chloride 101 - 111 mmol/L 102 106 105  CO2 22 - 32 mmol/L 30 27 28   Calcium 8.9 - 10.3 mg/dL 9.7 9.2 8.5    CBC Latest Ref Rng & Units 05/07/2016 12/26/2014 04/01/2014  WBC 3.6 - 11.0 K/uL 11.0 9.9 10.7  Hemoglobin 12.0 - 16.0 g/dL 57.814.0 46.914.1 62.913.2  Hematocrit 35.0 - 47.0 % 40.5 43.2 40.3  Platelets 150 - 440 K/uL 212 195 195    CXR: No new film  I have personally reviewed all chest radiographs reported above including CXRs and CT chest unless otherwise indicated  IMPRESSION:     ICD-10-CM   1. Mild-moderate OSA.  Very symptomatic with daytime hypersomnolence and morning headache.  Symptoms improved with CPAP. G47.33   2. Moderate persistent asthma.  Symptoms well controlled on ICS/LABA when she uses it.  Inadequate compliance with controller therapy J45.40 Pulmonary Function Test [facility] Only     PLAN:  Continue CPAP AutoSet 5-20 cm H2O with sleep Continue (resume) AirDuo inhaler, 1 inhalation twice a day.  Rinse mouth after use  I emphasized the importance of regular use of this medication Continue albuterol inhaler as  needed for increased shortness of breath, wheezing, cough, chest tightness  Follow-up in 3-4 months with PFTs (lung function test) prior to that visit   Billy Fischeravid Simonds, MD PCCM service Mobile [phone] Pager [phone] 09/03/2018 9:53 AM

## 2018-09-03 NOTE — Patient Instructions (Signed)
Continue CPAP with sleep Continue (resume) AirDuo inhaler, 1 inhalation twice a day.  Rinse mouth after use Continue albuterol inhaler as needed for increased shortness of breath, wheezing, cough, chest tightness  Follow-up in 3-4 months with PFTs (lung function test) prior to that visit

## 2018-09-12 DIAGNOSIS — G4733 Obstructive sleep apnea (adult) (pediatric): Secondary | ICD-10-CM | POA: Diagnosis not present

## 2018-09-24 ENCOUNTER — Ambulatory Visit: Payer: Federal, State, Local not specified - PPO | Admitting: Pulmonary Disease

## 2018-10-13 DIAGNOSIS — G4733 Obstructive sleep apnea (adult) (pediatric): Secondary | ICD-10-CM | POA: Diagnosis not present

## 2018-10-14 ENCOUNTER — Ambulatory Visit: Payer: Federal, State, Local not specified - PPO | Attending: Pulmonary Disease

## 2018-10-14 DIAGNOSIS — J4540 Moderate persistent asthma, uncomplicated: Secondary | ICD-10-CM

## 2018-10-14 MED ORDER — ALBUTEROL SULFATE (2.5 MG/3ML) 0.083% IN NEBU
2.5000 mg | INHALATION_SOLUTION | Freq: Once | RESPIRATORY_TRACT | Status: AC
  Administered 2018-10-14: 2.5 mg via RESPIRATORY_TRACT
  Filled 2018-10-14: qty 3

## 2018-10-22 ENCOUNTER — Encounter: Payer: Self-pay | Admitting: Pulmonary Disease

## 2018-10-22 ENCOUNTER — Ambulatory Visit: Payer: Federal, State, Local not specified - PPO | Admitting: Pulmonary Disease

## 2018-10-22 ENCOUNTER — Other Ambulatory Visit: Payer: Self-pay

## 2018-10-22 VITALS — BP 122/80 | HR 86 | Ht 60.0 in | Wt 195.6 lb

## 2018-10-22 DIAGNOSIS — J069 Acute upper respiratory infection, unspecified: Secondary | ICD-10-CM

## 2018-10-22 DIAGNOSIS — J4540 Moderate persistent asthma, uncomplicated: Secondary | ICD-10-CM | POA: Diagnosis not present

## 2018-10-22 DIAGNOSIS — G4733 Obstructive sleep apnea (adult) (pediatric): Secondary | ICD-10-CM

## 2018-10-22 NOTE — Patient Instructions (Signed)
Continue AirDuo inhaler twice a day.  Rinse mouth after use Continue albuterol inhaler as needed for increased shortness of breath, wheezing, chest tightness, cough Continue CPAP for obstructive sleep apnea If you notice increased wheezing and shortness of breath over this next week, contact us for a short course of prednisone  Follow-up 6 months.  Call sooner if needed

## 2018-10-29 NOTE — Progress Notes (Signed)
PULMONARY OFFICE FOLLOW UP NOTE  Requesting MD/Service: Self Date of initial consultation: 07/23/2018 Reason for consultation: Asthma  PT PROFILE: 39 y.o. female never smoker diagnosed with asthma as a child self-referred for evaluation of persistent asthma symptoms and exertional dyspnea.  DATA: 02/01/18 PSG: Overall AHI 1.8/hour.  Supine position AHI 43/hour 07/25/18 Home PSG: AHI 14/hr. Autoset CPAP recommended 5-20 cm H2O 10/14/18 PFTs: FVC: 2.92 > 3.04 L (91 > 95 %pred), FEV1: 2.39 > 2.47 L (89 > 93 %pred), FEV1/FVC: 82%, TLC: 4.66 L (104 %pred), DLCO 112 %pred    INTERVAL: Last visit 09/03/2018.  No major events in the interim  SUBJ: This is a scheduled follow-up.  Since last visit there have been no major problems.  However, in the week prior to this encounter she did note some mild increased cough.  She is tolerating CPAP well.  She denies daytime hypersomnolence and morning headache.  She remains on air duo inhaler.  She rarely uses albuterol inhaler.   Vitals:   10/22/18 0849  BP: 122/80  Pulse: 86  SpO2: 96%  Weight: 195 lb 9.6 oz (88.7 kg)  Height: 5' (1.524 m)  RA  EXAM:  Gen: Moderately obese, NAD HEENT: NCAT, sclerae white Neck: No JVD Lungs: breath sounds full, no wheezes or other adventitious sounds Cardiovascular: RRR, no murmurs Abdomen: Soft, nontender, normal BS Ext: without clubbing, cyanosis, edema Neuro: grossly intact Skin: Limited exam, no lesions noted   DATA:   BMP Latest Ref Rng & Units 05/07/2016 12/26/2014 04/01/2014  Glucose 65 - 99 mg/dL 218(C) 883(D) 744(Z)  BUN 6 - 20 mg/dL 15 13 15   Creatinine 0.44 - 1.00 mg/dL 1.46 0.47 9.98  Sodium 135 - 145 mmol/L 138 139 142  Potassium 3.5 - 5.1 mmol/L 3.9 3.4(L) 3.8  Chloride 101 - 111 mmol/L 102 106 105  CO2 22 - 32 mmol/L 30 27 28   Calcium 8.9 - 10.3 mg/dL 9.7 9.2 8.5    CBC Latest Ref Rng & Units 05/07/2016 12/26/2014 04/01/2014  WBC 3.6 - 11.0 K/uL 11.0 9.9 10.7  Hemoglobin 12.0 - 16.0  g/dL 72.1 58.7 27.6  Hematocrit 35.0 - 47.0 % 40.5 43.2 40.3  Platelets 150 - 440 K/uL 212 195 195    CXR: No new film  I have personally reviewed all chest radiographs reported above including CXRs and CT chest unless otherwise indicated  IMPRESSION:     ICD-10-CM   1. Moderate persistent asthma, well controlled on ICS/LABA (AirDuo) J45.40   2. Obstructive sleep apnea, well controlled G47.33   3. Suspected URI J06.9      PLAN:  Continue AirDuo inhaler twice a day.  Rinse mouth after use Continue albuterol inhaler as needed for increased shortness of breath, wheezing, chest tightness, cough Continue CPAP for obstructive sleep apnea She is instructed to contact us if symptoms of URI do not resolve or if she develops symptoms of asthma exacerbation  Follow-up 6 months.  Call sooner if needed   Billy Fischer, MD PCCM service Mobile [phone] Pager [phone] 10/29/2018 4:13 PM

## 2018-11-02 ENCOUNTER — Telehealth: Payer: Self-pay | Admitting: Pulmonary Disease

## 2018-11-02 NOTE — Telephone Encounter (Signed)
Pt called and wanted to know if she could get a work note (not putting her out of work but limit to not seeing pts). She has Asthma & prone to getting Bronchitis. 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

## 2018-11-04 ENCOUNTER — Ambulatory Visit: Payer: Federal, State, Local not specified - PPO

## 2018-11-04 NOTE — Telephone Encounter (Signed)
Please ask if her office has N95 masks. If so, she may use these for patient contact. Otherwise, I recommend non-patient contact

## 2018-11-04 NOTE — Telephone Encounter (Signed)
I spoke to patient, her office does not have N-95 masks, therefore I advised patient that per Dr. Sung Amabile she does not need to have patient contact. Patient also explained she works at [employer] as a Conservation officer, nature and wanted to know what she should. Per Dr. Sung Amabile, she can use just a regular mask for [employer]. Will put in a letter for patients' work and mail hard copy to her. Nothing further needed at this time.

## 2018-11-08 ENCOUNTER — Telehealth: Payer: Self-pay

## 2018-11-08 ENCOUNTER — Telehealth: Payer: Self-pay | Admitting: Pulmonary Disease

## 2018-11-08 NOTE — Telephone Encounter (Signed)
Copied from CRM [id number]. Topic: General - Inquiry >> Nov 08, 2018  2:13 PM Lynne Logan D wrote: Reason for CRM: Pt stated she needs a note from Dr. Birdie Sons stating that she has respiratory issues due to her asthma and history of bronchitis. She needs the note to state that she should not be working around people so that she may be out of work for 30 days. Please advise pt. CB#[phone]

## 2018-11-08 NOTE — Telephone Encounter (Signed)
Message routed to [facility] triage to follow up

## 2018-11-08 NOTE — Telephone Encounter (Signed)
Please find out what she does for work. 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 Please let her know that if I am to provide a letter it would outline her history of asthma and bronchitis and that she is at higher risk if she were to become sick. It would not guarantee that she would be out of work with pay or the duration of her being able to be out of work.

## 2018-11-08 NOTE — Telephone Encounter (Signed)
Called and spoke to pt.  Pt is requesting a letter for [employer] stating that she should remain out of work for 30 days, due to her respiratory diagnosis.   DS please advise if okay to write letter. Thanks.

## 2018-11-08 NOTE — Telephone Encounter (Signed)
lmtcb x1 for pt. 

## 2018-11-08 NOTE — Telephone Encounter (Signed)
Sent to PCP to advise 

## 2018-11-08 NOTE — Telephone Encounter (Signed)
Please see 11/02/18 phone note.

## 2018-11-08 NOTE — Telephone Encounter (Signed)
Pt called back and stated that her second job ([employer]) made all staff aware this weekend that if you are 60 yrs or older or anyone with respiratory issues can not report to work but will need documentation. Pt is requesting documentation/ work note for this.

## 2018-11-09 NOTE — Telephone Encounter (Signed)
Pt is calling for update on below message. I have made pt aware that DS is currently out of the office until 11/11/18. Pt voiced her understanding and stated that she would call back on Thursday for update.

## 2018-11-09 NOTE — Telephone Encounter (Addendum)
Prior message noted.  Letter reviewed from pulmonology.  It appears that they advised that she could wear a mask at work.  I do believe she is high risk for complications given her asthma history.  I can provide her with a letter advising of her history and that she is high risk given the current issues with COVID 19.

## 2018-11-09 NOTE — Telephone Encounter (Signed)
Called and spoke with pt. Advised pt that the message was sent to PCP and the letter might not be ready today but hopefully sometime this week we will call once ready. Pt advised and voiced understanding.

## 2018-11-09 NOTE — Telephone Encounter (Signed)
Pt is calling back. Cb is [phone].

## 2018-11-09 NOTE — Telephone Encounter (Signed)
Called and spoke with pt. 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    Pt stated what she is needing is to be excused from her part time job at [employer] in [place]. Pt stated that [employer] is offering pay to co-workers that are 28 and older or have chronic illnesses. Pt stated that she has chronic asthma and Hx with Bronchitis she has tried to reach out to her Pulmonary doctor who is with [facility] however the doctor is not in the office. Pt will need this letter before Friday to be excused. Pt is just very worried because she feels that she is high risk if she were to be exposed to it turing into something more serious due to her asthma.   Pt would like a call once letter is done and fax it to Fax number # [phone]

## 2018-11-09 NOTE — Telephone Encounter (Signed)
Patient calling to check the status of getting this work note. Advised that the message was sent to Dr Birdie Sons, just waiting for him to advise. Patient states that she would like to get this note today or tomorrow, so her job has time to find someone to replace her hours.

## 2018-11-10 NOTE — Telephone Encounter (Signed)
Letter has been faxed. Called and spoke with pt. Pt advised and voiced understanding.

## 2018-11-11 DIAGNOSIS — G4733 Obstructive sleep apnea (adult) (pediatric): Secondary | ICD-10-CM | POA: Diagnosis not present

## 2018-11-11 NOTE — Telephone Encounter (Signed)
If this is due to concern re: Covid-19, there are no national guidelines that recommend that she be excused from work due to asthma. She should follow the recommended guidelines for social distancing and hand hygiene.  If she needs to discuss this further, I am willing to do so in person or with telephone visit  Thanks  Theodoro Grist

## 2018-11-11 NOTE — Telephone Encounter (Signed)
Pt is requesting a letter that states her respiratory diagnosis, as requested by [employer]. Per Dr. Sung Amabile verbally, okay to write a letter stating pt's diagnosis.  Letter has been written and has been mailed to address on file per pt request.  Nothing further is needed.

## 2018-11-12 DIAGNOSIS — G4733 Obstructive sleep apnea (adult) (pediatric): Secondary | ICD-10-CM | POA: Diagnosis not present

## 2018-12-12 DIAGNOSIS — G4733 Obstructive sleep apnea (adult) (pediatric): Secondary | ICD-10-CM | POA: Diagnosis not present

## 2019-01-11 DIAGNOSIS — G4733 Obstructive sleep apnea (adult) (pediatric): Secondary | ICD-10-CM | POA: Diagnosis not present

## 2019-02-11 DIAGNOSIS — G4733 Obstructive sleep apnea (adult) (pediatric): Secondary | ICD-10-CM | POA: Diagnosis not present

## 2019-02-21 ENCOUNTER — Ambulatory Visit
Admission: RE | Admit: 2019-02-21 | Discharge: 2019-02-21 | Disposition: A | Discharge status: Home / Self Care | Payer: Federal, State, Local not specified - PPO | Source: Ambulatory Visit | Attending: Family Medicine | Admitting: Family Medicine

## 2019-02-21 ENCOUNTER — Telehealth: Payer: Self-pay | Admitting: Family Medicine

## 2019-02-21 ENCOUNTER — Ambulatory Visit (INDEPENDENT_AMBULATORY_CARE_PROVIDER_SITE_OTHER): Payer: Federal, State, Local not specified - PPO | Admitting: Family Medicine

## 2019-02-21 ENCOUNTER — Other Ambulatory Visit: Payer: Self-pay

## 2019-02-21 ENCOUNTER — Encounter: Payer: Self-pay | Admitting: Family Medicine

## 2019-02-21 VITALS — BP 108/72 | HR 79 | Temp 99.0°F | Resp 16 | Ht 60.0 in | Wt 196.0 lb

## 2019-02-21 DIAGNOSIS — R600 Localized edema: Secondary | ICD-10-CM

## 2019-02-21 MED ORDER — FUROSEMIDE 20 MG PO TABS: 20.0000 mg | ORAL_TABLET | Freq: Every day | ORAL | 0 refills | Status: DC

## 2019-02-21 NOTE — Progress Notes (Signed)
Subjective:    Patient ID: Stacy Yang, female    DOB: November 22, 1980, 38 y.o.   MRN: [id number]  HPI   Patient presents to clinic complaining of bilateral leg and foot swelling, left worse than right.  Noticed over this past weekend.  States she and her family rode approximately an hour and 45 minutes in the car, with kayaking for a few hours, then rolled back an hour and 45 minutes in the car again.  Otherwise she denies any known history of blood clotting disorders in herself or her family.  Patient states she became concerned because during her pregnancy she did have preeclampsia and ended up retaining a lot of fluid.  Patient states she does try to avoid salt in her diet, but has been eating out more often recently.   Patient Active Problem List   Diagnosis Date Noted  . Asthma exacerbation 11/20/2017  . IBS (irritable bowel syndrome) 06/26/2017  . Anxiety and depression 05/29/2017  . Chronic abdominal pain 05/29/2017  . Dysphagia 05/29/2017  . Recent skin changes 05/29/2017  . 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 Chronic bilateral low back pain without sciatica 01/12/2015  . Chronic pain of both knees 01/12/2015  . Internal hemorrhoids 01/12/2015  . Snoring 01/12/2015  . Asthma 11/12/2013  . Obesity (BMI 35.0-39.9 without comorbidity) 11/07/2013   Social History   Tobacco Use  . Smoking status: Never Smoker  . Smokeless tobacco: Never Used  Substance Use Topics  . Alcohol use: No   Review of Systems  Constitutional: Negative for chills, fatigue and fever.  HENT: Negative for congestion, ear pain, sinus pain and sore throat.   Eyes: Negative.   Respiratory: Negative for cough, shortness of breath and wheezing.   Cardiovascular: Negative for chest pain, palpitations. +leg/foot swelling - left more than right Gastrointestinal: Negative for abdominal pain, diarrhea, nausea and vomiting.  Genitourinary: Negative for dysuria, frequency and urgency.  Musculoskeletal:  Negative for arthralgias and myalgias.  Skin: Negative for color change, pallor and rash.  Neurological: Negative for syncope, light-headedness and headaches.  Psychiatric/Behavioral: The patient is not nervous/anxious.       Objective:   Physical Exam Vitals signs and nursing note reviewed.  Constitutional:      General: She is not in acute distress.    Appearance: She is not ill-appearing, toxic-appearing or diaphoretic.  HENT:     Head: Normocephalic and atraumatic.  Cardiovascular:     Rate and Rhythm: Normal rate and regular rhythm.     Pulses: Normal pulses.     Comments: Pedal pulses +2 bilat Pulmonary:     Effort: Pulmonary effort is normal. No respiratory distress.     Breath sounds: Normal breath sounds. No stridor. No wheezing, rhonchi or rales.  Musculoskeletal:     Right lower leg: Edema (+1 edema in ankle/foot) present.     Left lower leg: Edema (+2 edema in ankle/foot) present.  Skin:    General: Skin is warm and dry.     Coloration: Skin is not jaundiced or pale.     Findings: No bruising or erythema.  Neurological:     General: No focal deficit present.     Mental Status: She is alert and oriented to person, place, and time.     Gait: Gait normal.  Psychiatric:        Mood and Affect: Mood normal.        Behavior: Behavior normal.     Today's Vitals   02/21/19 1529  BP: 108/72  Pulse: 79  Resp: 16  Temp: 99 F (37.2 C)  TempSrc: Oral  SpO2: 98%  Weight: 196 lb (88.9 kg)  Height: 5' (1.524 m)   Body mass index is 38.28 kg/m.    Assessment & Plan:    A total of 25  minutes were spent face-to-face with the patient during this encounter and over half of that time was spent on counseling and coordination of care. The patient was counseled on work-up plan, possible causes of swelling, avoidance of salt in diet, elevating legs, wearing tall socks.  Edema right lower extremity, edema of left lower extremity - edema and left lower extremity is more  prominent than in the right.  Patient's Wells criteria for DVT risk is low, but due to left being +2 edema when compared to the right of being +1 edema, I want to be sure and rule out blood clot.  We will get Doppler.  We will also draw CBC and CMP to look for any anemia, elevated white cells, electrolyte imbalance concerns.  I do suspect fluid buildup causing the edema, patient will take Lasix 20 mg daily to help drop fluid 4.  Of 5 days.  Also been advised to elevate legs whenever able.  Doppler study is negative for DVT.  Patient made aware that I do feel that the swelling is related to fluid buildup/needing to elevate legs more often avoiding salt in diet.  Patient understands these instructions and will do this.  Advised patient that if swelling persists or worsens to call office and let us know.

## 2019-02-21 NOTE — Telephone Encounter (Signed)
Thanks

## 2019-02-21 NOTE — Patient Instructions (Signed)
Edema  Edema is when you have too much fluid in your body or under your skin. Edema may make your legs, feet, and ankles swell up. Swelling is also common in looser tissues, like around your eyes. This is a common condition. It gets more common as you get older. There are many possible causes of edema. Eating too much salt (sodium) and being on your feet or sitting for a long time can cause edema in your legs, feet, and ankles. Hot weather may make edema worse. Edema is usually painless. Your skin may look swollen or shiny. Follow these instructions at home:  Keep the swollen body part raised (elevated) above the level of your heart when you are sitting or lying down.  Do not sit still or stand for a long time.  Do not wear tight clothes. Do not wear garters on your upper legs.  Exercise your legs. This can help the swelling go down.  Wear elastic bandages or support stockings as told by your doctor.  Eat a low-salt (low-sodium) diet to reduce fluid as told by your doctor.  Depending on the cause of your swelling, you may need to limit how much fluid you drink (fluid restriction).  Take over-the-counter and prescription medicines only as told by your doctor. Contact a doctor if:  Treatment is not working.  You have heart, liver, or kidney disease and have symptoms of edema.  You have sudden and unexplained weight gain. Get help right away if:  You have shortness of breath or chest pain.  You cannot breathe when you lie down.  You have pain, redness, or warmth in the swollen areas.  You have heart, liver, or kidney disease and get edema all of a sudden.  You have a fever and your symptoms get worse all of a sudden. Summary  Edema is when you have too much fluid in your body or under your skin.  Edema may make your legs, feet, and ankles swell up. Swelling is also common in looser tissues, like around your eyes.  Raise (elevate) the swollen body part above the level of your  heart when you are sitting or lying down.  Follow your doctor's instructions about diet and how much fluid you can drink (fluid restriction). This information is not intended to replace advice given to you by your health care provider. Make sure you discuss any questions you have with your health care provider. Document Released: 01/21/2008 Document Revised: 08/07/2017 Document Reviewed: 08/22/2016 Elsevier Patient Education  2020 Elsevier Inc.  

## 2019-02-21 NOTE — Telephone Encounter (Signed)
Ultrasound was negative for DVT and reflux ,

## 2019-02-21 NOTE — Telephone Encounter (Signed)
Pease call and triage --- note says not urinating since Thursday --- that is very concerning, She may need catheter placed   Thanks!  LG

## 2019-02-21 NOTE — Telephone Encounter (Signed)
Almyra Free, Sonographer at [facility] called with the venous doppler report of negative DVT left lower extremity, result read back and verified in chart. She says the patient is still there, I advised to send her home since it's negative. I called Dr. Derrel Nip, on call physician, and advised of the results and that the patient was sent home by me, she verbalized understanding.

## 2019-02-21 NOTE — Telephone Encounter (Signed)
Just not urinating as often , legs and feet are swelling bilaterally Patient urinating 3 x per day but not much passing , no pain, no burning,tries to stay away from sodium. Bilateral edema since Thursday, not as much today, but does not go away completely when elevated.

## 2019-02-22 LAB — CBC WITH DIFFERENTIAL/PLATELET
Basophils Absolute: 0.1 10*3/uL (ref 0.0–0.1)
Basophils Relative: 1.4 % (ref 0.0–3.0)
Eosinophils Absolute: 0.1 10*3/uL (ref 0.0–0.7)
Eosinophils Relative: 1.3 % (ref 0.0–5.0)
HCT: 42.3 % (ref 36.0–46.0)
Hemoglobin: 14.2 g/dL (ref 12.0–15.0)
Lymphocytes Relative: 25.4 % (ref 12.0–46.0)
Lymphs Abs: 2 10*3/uL (ref 0.7–4.0)
MCHC: 33.7 g/dL (ref 30.0–36.0)
MCV: 90 fl (ref 78.0–100.0)
Monocytes Absolute: 0.4 10*3/uL (ref 0.1–1.0)
Monocytes Relative: 5.5 % (ref 3.0–12.0)
Neutro Abs: 5.3 10*3/uL (ref 1.4–7.7)
Neutrophils Relative %: 66.4 % (ref 43.0–77.0)
Platelets: 215 10*3/uL (ref 150.0–400.0)
RBC: 4.7 Mil/uL (ref 3.87–5.11)
RDW: 13.4 % (ref 11.5–15.5)
WBC: 8 10*3/uL (ref 4.0–10.5)

## 2019-02-22 LAB — COMPREHENSIVE METABOLIC PANEL
ALT: 14 U/L (ref 0–35)
AST: 13 U/L (ref 0–37)
Albumin: 4.2 g/dL (ref 3.5–5.2)
Alkaline Phosphatase: 67 U/L (ref 39–117)
BUN: 12 mg/dL (ref 6–23)
CO2: 28 mEq/L (ref 19–32)
Calcium: 8.7 mg/dL (ref 8.4–10.5)
Chloride: 104 mEq/L (ref 96–112)
Creatinine, Ser: 0.78 mg/dL (ref 0.40–1.20)
GFR: 82.02 mL/min (ref >60.00)
Glucose, Bld: 92 mg/dL (ref 70–99)
Potassium: 4 mEq/L (ref 3.5–5.1)
Sodium: 140 mEq/L (ref 135–145)
Total Bilirubin: 0.7 mg/dL (ref 0.2–1.2)
Total Protein: 6.7 g/dL (ref 6.0–8.3)

## 2019-02-22 NOTE — Telephone Encounter (Signed)
Great thank you!

## 2019-03-11 ENCOUNTER — Ambulatory Visit: Payer: Self-pay | Admitting: Family Medicine

## 2019-03-11 ENCOUNTER — Other Ambulatory Visit: Payer: Self-pay

## 2019-03-11 ENCOUNTER — Ambulatory Visit (INDEPENDENT_AMBULATORY_CARE_PROVIDER_SITE_OTHER): Payer: Federal, State, Local not specified - PPO | Admitting: Family Medicine

## 2019-03-11 DIAGNOSIS — R509 Fever, unspecified: Secondary | ICD-10-CM

## 2019-03-11 DIAGNOSIS — R059 Cough, unspecified: Secondary | ICD-10-CM

## 2019-03-11 DIAGNOSIS — R0602 Shortness of breath: Secondary | ICD-10-CM | POA: Diagnosis not present

## 2019-03-11 DIAGNOSIS — J209 Acute bronchitis, unspecified: Secondary | ICD-10-CM | POA: Diagnosis not present

## 2019-03-11 DIAGNOSIS — Z20822 Contact with and (suspected) exposure to covid-19: Secondary | ICD-10-CM

## 2019-03-11 DIAGNOSIS — R5383 Other fatigue: Secondary | ICD-10-CM

## 2019-03-11 DIAGNOSIS — J019 Acute sinusitis, unspecified: Secondary | ICD-10-CM | POA: Diagnosis not present

## 2019-03-11 DIAGNOSIS — J4521 Mild intermittent asthma with (acute) exacerbation: Secondary | ICD-10-CM

## 2019-03-11 DIAGNOSIS — R05 Cough: Secondary | ICD-10-CM | POA: Diagnosis not present

## 2019-03-11 DIAGNOSIS — Z03818 Encounter for observation for suspected exposure to other biological agents ruled out: Secondary | ICD-10-CM | POA: Diagnosis not present

## 2019-03-11 DIAGNOSIS — Z20828 Contact with and (suspected) exposure to other viral communicable diseases: Secondary | ICD-10-CM | POA: Diagnosis not present

## 2019-03-11 NOTE — Telephone Encounter (Signed)
Pt called in c/o feeling a tightness in her chest "like something sitting on it".   "I don't feel short of breath really just this tightness".    See triage notes for more details.  I transferred the call into Dr. Ellen Henri office to [facility] for scheduling.  I sent my notes to the office.  Reason for Disposition . [1] MILD difficulty breathing (e.g., minimal/no SOB at rest, SOB with walking, pulse <100) AND [2] NEW-onset or WORSE than normal  Answer Assessment - Initial Assessment Questions 1. RESPIRATORY STATUS: "Describe your breathing?" (e.g., wheezing, shortness of breath, unable to speak, severe coughing)      Wed starting getting worse.    It's tightness in my chest.   It's in the center of my chest.    It's not a pain it's just tight like something is sitting on my chest. 2. ONSET: "When did this breathing problem begin?"      Monday.     3. PATTERN "Does the difficult breathing come and go, or has it been constant since it started?"      I have asthma so I cough all the time anyway.     4. SEVERITY: "How bad is your breathing?" (e.g., mild, moderate, severe)    - MILD: No SOB at rest, mild SOB with walking, speaks normally in sentences, can lay down, no retractions, pulse < 100.    - MODERATE: SOB at rest, SOB with minimal exertion and prefers to sit, cannot lie down flat, speaks in phrases, mild retractions, audible wheezing, pulse 100-120.    - SEVERE: Very SOB at rest, speaks in single words, struggling to breathe, sitting hunched forward, retractions, pulse > 120      Last couple of a days it's worse.   It's worse when I lay down. 5. RECURRENT SYMPTOM: "Have you had difficulty breathing before?" If so, ask: "When was the last time?" and "What happened that time?"      My COVID-19 test was negative yesterday. 6. CARDIAC HISTORY: "Do you have any history of heart disease?" (e.g., heart attack, angina, bypass surgery, angioplasty)      Cardiomyopathy with my pregnancy 2001.   No  problems since.   I had fluid build up in my legs 3 wks ago.   Not swollen now.   I was put on a fluid pill for 5 days which helped. 7. LUNG HISTORY: "Do you have any history of lung disease?"  (e.g., pulmonary embolus, asthma, emphysema)     Asthma bad.   This feels different.   I've not had a asthma in a very long time. 8. CAUSE: "What do you think is causing the breathing problem?"      I want to rule out bronchitis or pneumonia.   I tend to get pneumonia every year.  I've had a fever on and off for a couple of days.   99.2 now.   As high as 100.6 on and off this past week.    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    I also work at [employer] but I've not been face to face with pts due to my asthma.    9. OTHER SYMPTOMS: "Do you have any other symptoms? (e.g., dizziness, runny nose, cough, chest pain, fever)     Did have a sore throat on and off and a stuffy nose for the past week. 10. PREGNANCY: "Is there any chance you are pregnant?" "When  was your last menstrual period?"       No 11. TRAVEL: "Have you traveled out of the country in the last month?" (e.g., travel history, exposures)       No travels.  My daughter stayed with my Aunt and Uncle who came up positive for COVID-19 and started getting sick.   I don't know daughter's results yet.    [facility]  Protocols used: BREATHING DIFFICULTY-A-AH

## 2019-03-11 NOTE — Telephone Encounter (Signed)
Patient was scheduled to see L. Guse today.  Stacy Yang,cma

## 2019-03-11 NOTE — Progress Notes (Signed)
Patient ID: Stacy Yang, female   DOB: August 03, 1981, 38 y.o.   MRN: [id number]    Virtual Visit via video Note  This visit type was conducted due to national recommendations for restrictions regarding the COVID-19 pandemic (e.g. social distancing).  This format is felt to be most appropriate for this patient at this time.  All issues noted in this document were discussed and addressed.  No physical exam was performed (except for noted visual exam findings with Video Visits).   I connected with Ulah Olmo today at  1:40 PM EDT by a video enabled telemedicine application and verified that I am speaking with the correct person using two identifiers. Location patient: home Location provider: work or home office Persons participating in the virtual visit: patient, provider  I discussed the limitations, risks, security and privacy concerns of performing an evaluation and management service by telephone and the availability of in person appointments. I also discussed with the patient that there may be a patient responsible charge related to this service. The patient expressed understanding and agreed to proceed.   HPI:  Patient and I connected via video due to symptoms of shortness of breath, cough, congestion, fever and generally feeling fatigued.  Patient states her daughter was tested for COVID 19 earlier this week, but currently her results are not back.  Patient states [facility] and was told her results from the [place] are negative.  Patient states she has had coworkers who have tested positive for COVID-19, so it is possible she was exposed to someone who did have coronavirus. Even though her test from the [place] is negative, she continues to have symptoms of potential coronavirus or some viral illness.  Patient also has a history of seasonal allergies and asthma,'s asthma symptoms do tend to kick up during the very hot summer months.  Temperature has  been ranging 99-100.6 at its maximum.  She has been taking Tylenol as needed, getting plenty of rest and try to keep up good fluid intake.  She has been using Mucinex as needed for cough and it takes her daily inhaler regularly and uses albuterol as needed.    ROS: See pertinent positives and negatives per HPI.  Past Medical History:  Diagnosis Date  . Acne   . Asthma     Past Surgical History:  Procedure Laterality Date  . CESAREAN SECTION     x3  . ESOPHAGOGASTRODUODENOSCOPY (EGD) WITH PROPOFOL N/A 07/15/2017   Procedure: ESOPHAGOGASTRODUODENOSCOPY (EGD) WITH PROPOFOL;  Surgeon: Lin Landsman, MD;  Location: [place];  Service: Endoscopy;  Laterality: N/A;  . TUBAL LIGATION      Family History  Problem Relation Age of Onset  . Kidney disease Mother   . Alcohol abuse Father   . Diabetes Maternal Aunt   . Diabetes Maternal Grandmother   . Lung cancer Maternal Grandfather   . Cirrhosis Paternal Grandfather    Social History   Tobacco Use  . Smoking status: Never Smoker  . Smokeless tobacco: Never Used  Substance Use Topics  . Alcohol use: No    Current Outpatient Medications:  .  albuterol (PROVENTIL HFA;VENTOLIN HFA) 108 (90 Base) MCG/ACT inhaler, Inhale 2 puffs into the lungs every 6 (six) hours as needed for wheezing or shortness of breath., Disp: 2 Inhaler, Rfl: 2 .  Fluticasone-Salmeterol (AIRDUO RESPICLICK 505/39) 767-34 MCG/ACT AEPB, Inhale 1 puff into the lungs 2 (two) times daily., Disp: 1 each, Rfl: 10  EXAM:  GENERAL: alert, oriented, appears well and in no acute distress  HEENT: atraumatic, conjunttiva clear, no obvious abnormalities on inspection of external nose and ears  NECK: normal movements of the head and neck  LUNGS: on inspection no signs of respiratory distress, breathing rate appears normal, no obvious gross SOB, gasping or wheezing  CV: no obvious cyanosis  MS: moves all visible extremities without noticeable abnormality   PSYCH/NEURO: pleasant and cooperative, no obvious depression or anxiety, speech and thought processing grossly intact  ASSESSMENT AND PLAN:  Discussed the following assessment and plan:  Suspected COVID-19 virus infection, fatigue, fever, cough - advised that due to symptoms, we need to get patient set up for COVID-19 testing.  Patient advised that I will put order in and he can go to testing location for for drive-through testing. Patient given the address of testing site.  Patient advised that testing is taking 2 to 7 days to result, and while we are awaiting results patient must remain under self quarantine and monitor for any changing/worsening symptoms.  Advised over-the-counter medications such as Tylenol can be used to help treat pain or fevers, Robitussin can be used to help calm cough, allergy medication such as Claritin or Allegra can help reduce congestion.  Also discussed getting plenty of rest and increasing fluid intake.  Made patient aware that test results as well as how his symptoms progress will determine when the self quarantine will be able to end.  Also advised to monitor self for any worsening symptoms, advised if severe shortness of breath develops, high fever that is not reduced with use of Tylenol, chest pain, severe vomiting or diarrhea  --patient must call on-call and or go to ER right away for evaluation. patient verbalized understanding of these instructions.  Asthma - symptoms could be related to an asthma exacerbation from viral type illness such as a bronchitis.  Advised patient to continue Mucinex to calm cough and take her daily inhaler and use albuterol as needed.  Out of work note written for patient and released to her MyChart.  Advised that return to work date will be dependent on COVID-19 testing results and her symptoms   I discussed the assessment and treatment plan with the patient. The patient was provided an opportunity to ask questions and all were answered.  The patient agreed with the plan and demonstrated an understanding of the instructions.   The patient was advised to call back or seek an in-person evaluation if the symptoms worsen or if the condition fails to improve as anticipated.   Tracey HarriesLauren M Devian Bartolomei, FNP

## 2019-03-13 DIAGNOSIS — G4733 Obstructive sleep apnea (adult) (pediatric): Secondary | ICD-10-CM | POA: Diagnosis not present

## 2019-03-27 DIAGNOSIS — Z20828 Contact with and (suspected) exposure to other viral communicable diseases: Secondary | ICD-10-CM | POA: Diagnosis not present

## 2019-04-13 DIAGNOSIS — G4733 Obstructive sleep apnea (adult) (pediatric): Secondary | ICD-10-CM | POA: Diagnosis not present

## 2019-05-13 ENCOUNTER — Ambulatory Visit (INDEPENDENT_AMBULATORY_CARE_PROVIDER_SITE_OTHER): Payer: Federal, State, Local not specified - PPO | Admitting: Internal Medicine

## 2019-05-13 ENCOUNTER — Ambulatory Visit: Payer: Federal, State, Local not specified - PPO | Admitting: Internal Medicine

## 2019-05-13 ENCOUNTER — Encounter: Payer: Self-pay | Admitting: Internal Medicine

## 2019-05-13 ENCOUNTER — Other Ambulatory Visit: Payer: Self-pay

## 2019-05-13 ENCOUNTER — Ambulatory Visit
Admission: RE | Admit: 2019-05-13 | Discharge: 2019-05-13 | Disposition: A | Discharge status: Home / Self Care | Payer: Federal, State, Local not specified - PPO | Source: Ambulatory Visit | Attending: Internal Medicine | Admitting: Internal Medicine

## 2019-05-13 VITALS — BP 124/88 | HR 90 | Temp 97.0°F | Ht 60.0 in | Wt 195.8 lb

## 2019-05-13 DIAGNOSIS — U071 COVID-19: Secondary | ICD-10-CM | POA: Diagnosis not present

## 2019-05-13 DIAGNOSIS — R05 Cough: Secondary | ICD-10-CM | POA: Diagnosis not present

## 2019-05-13 DIAGNOSIS — J4521 Mild intermittent asthma with (acute) exacerbation: Secondary | ICD-10-CM

## 2019-05-13 MED ORDER — AZITHROMYCIN 250 MG PO TABS: ORAL_TABLET | ORAL | 0 refills | Status: DC

## 2019-05-13 MED ORDER — PREDNISONE 20 MG PO TABS: 40.0000 mg | ORAL_TABLET | Freq: Every day | ORAL | 0 refills | Status: DC

## 2019-05-13 NOTE — Patient Instructions (Addendum)
Prednisone 40 mg daily for 10 days Z-Pak Obtain CXR

## 2019-05-13 NOTE — Progress Notes (Signed)
PULMONARY OFFICE FOLLOW UP NOTE  Requesting MD/Service: Self Date of initial consultation: 07/23/2018 Reason for consultation: Asthma  PT PROFILE: 38 y.o. female never smoker diagnosed with asthma as a child self-referred for evaluation of persistent asthma symptoms and exertional dyspnea.  DATA: 02/01/18 PSG: Overall AHI 1.8/hour.  Supine position AHI 43/hour 07/25/18 Home PSG: AHI 14/hr. Autoset CPAP recommended 5-20 cm H2O 10/14/18 PFTs: FVC: 2.92 > 3.04 L (91 > 95 %pred), FEV1: 2.39 > 2.47 L (89 > 93 %pred), FEV1/FVC: 82%, TLC: 4.66 L (104 %pred), DLCO 112 %pred    INTERVAL: Last visit 09/03/2018.  No major events in the interim   CC  follow up ASTHMA Follow up OSA  HPI  OQ:HUTMLY On air doe inhaler Albuterol as needed-infrequent use Recent COVID-19 infection August 6 Has increased cough +Chest congestion +Wheezing   Re: OSA Tolerating CPAP Excessive daytime sleepiness resolved Uses and benefits from therapy  +ASTHMA exacerbation at this time No evidence of heart failure at this time No evidence or signs of infection at this time No respiratory distress No fevers, chills, nausea, vomiting, diarrhea No evidence of lower extremity edema No evidence hemoptysis    Review of Systems:  Gen:  Denies  fever, sweats, chills weight loss  HEENT: Denies blurred vision, double vision, ear pain, eye pain, hearing loss, nose bleeds, sore throat Cardiac:  No dizziness, chest pain or heaviness, chest tightness,edema, No JVD Resp:   No cough, -sputum production, -shortness of breath,-wheezing, -hemoptysis,  Gi: Denies swallowing difficulty, stomach pain, nausea or vomiting, diarrhea, constipation, bowel incontinence Gu:  Denies bladder incontinence, burning urine Ext:   Denies Joint pain, stiffness or swelling Skin: Denies  skin rash, easy bruising or bleeding or hives Endoc:  Denies polyuria, polydipsia , polyphagia or weight change Psych:   Denies depression, insomnia or  hallucinations  Other:  All other systems negative  BP 124/88   Pulse 90   Temp (!) 97 F (36.1 C) (Temporal)   Ht 5' (1.524 m)   Wt 195 lb 12.8 oz (88.8 kg)   SpO2 97%   BMI 38.24 kg/m    Physical Examination:   GENERAL:NAD, no fevers, chills, no weakness no fatigue HEAD: Normocephalic, atraumatic.  EYES: PERLA, EOMI No scleral icterus.  NECK: Supple. No thyromegaly.  No JVD.  PULMONARY: CTA B/L no wheezing, rhonchi, crackles CARDIOVASCULAR: S1 and S2. Regular rate and rhythm. No murmurs GASTROINTESTINAL: Soft, nontender, nondistended. Positive bowel sounds.  MUSCULOSKELETAL: No swelling, clubbing, or edema.  NEUROLOGIC: No gross focal neurological deficits. 5/5 strength all extremities SKIN: No ulceration, lesions, rashes, or cyanosis.  PSYCHIATRIC: Insight, judgment intact. -depression -anxiety ALL OTHER ROS ARE NEGATIVE        CXR: No new film  I have personally reviewed all chest radiographs reported above including CXRs and CT chest unless otherwise indicated   38 yo white female with mild asthma exacerbation related to COVID-19 infection in the setting of underlying sleep apnea  Asthma exacerbation Start prednisone 40 mg daily for 10 days Start Z-Pak Obtain CXR  Moderate persistent asthma Continue inhalers as prescribed Albuterol as needed  OSA Continue CPAP as prescribed    COVID-19 EDUCATION: The signs and symptoms of COVID-19 were discussed with the patient and how to seek care for testing.  The importance of social distancing was discussed today. Hand Washing Techniques and avoid touching face was advised.   MEDICATION ADJUSTMENTS/LABS AND TESTS ORDERED: Prednisone 40 mg daily for 10 days Z-Pak   CURRENT MEDICATIONS REVIEWED AT  LENGTH WITH PATIENT TODAY   Patient satisfied with Plan of action and management. All questions answered  Follow up in 6 months   Mahlia Fernando Patricia Pesa, M.D.  [facility]  Medical  Director [place] Director [facility] Cardio-Pulmonary Department

## 2019-05-14 DIAGNOSIS — G4733 Obstructive sleep apnea (adult) (pediatric): Secondary | ICD-10-CM | POA: Diagnosis not present

## 2019-07-13 DIAGNOSIS — G4733 Obstructive sleep apnea (adult) (pediatric): Secondary | ICD-10-CM | POA: Diagnosis not present

## 2019-08-25 ENCOUNTER — Other Ambulatory Visit: Payer: Self-pay

## 2019-08-25 ENCOUNTER — Ambulatory Visit
Admission: RE | Admit: 2019-08-25 | Discharge: 2019-08-25 | Disposition: A | Discharge status: Home / Self Care | Payer: Federal, State, Local not specified - PPO | Source: Ambulatory Visit | Attending: Pulmonary Disease | Admitting: Pulmonary Disease

## 2019-08-25 ENCOUNTER — Ambulatory Visit (INDEPENDENT_AMBULATORY_CARE_PROVIDER_SITE_OTHER): Payer: Federal, State, Local not specified - PPO | Admitting: Pulmonary Disease

## 2019-08-25 ENCOUNTER — Other Ambulatory Visit
Admission: RE | Admit: 2019-08-25 | Discharge: 2019-08-25 | Disposition: A | Discharge status: Home / Self Care | Payer: Federal, State, Local not specified - PPO | Source: Ambulatory Visit | Attending: Pulmonary Disease | Admitting: Pulmonary Disease

## 2019-08-25 ENCOUNTER — Encounter: Payer: Self-pay | Admitting: Pulmonary Disease

## 2019-08-25 VITALS — BP 126/74 | HR 87 | Temp 97.2°F | Ht 60.0 in | Wt 188.4 lb

## 2019-08-25 DIAGNOSIS — R0602 Shortness of breath: Secondary | ICD-10-CM

## 2019-08-25 DIAGNOSIS — J4521 Mild intermittent asthma with (acute) exacerbation: Secondary | ICD-10-CM | POA: Diagnosis not present

## 2019-08-25 DIAGNOSIS — U071 COVID-19: Secondary | ICD-10-CM | POA: Insufficient documentation

## 2019-08-25 DIAGNOSIS — J45909 Unspecified asthma, uncomplicated: Secondary | ICD-10-CM

## 2019-08-25 LAB — BASIC METABOLIC PANEL
Anion gap: 9 (ref 5–15)
BUN: 14 mg/dL (ref 6–20)
CO2: 26 mmol/L (ref 22–32)
Calcium: 9 mg/dL (ref 8.9–10.3)
Chloride: 101 mmol/L (ref 98–111)
Creatinine, Ser: 0.73 mg/dL (ref 0.44–1.00)
GFR calc Af Amer: >60 mL/min (ref >60)
GFR calc non Af Amer: >60 mL/min (ref >60)
Glucose, Bld: 100 mg/dL — ABNORMAL HIGH (ref 70–99)
Potassium: 4.6 mmol/L (ref 3.5–5.1)
Sodium: 136 mmol/L (ref 135–145)

## 2019-08-25 LAB — CBC WITH DIFFERENTIAL/PLATELET
Abs Immature Granulocytes: 0.03 10*3/uL (ref 0.00–0.07)
Basophils Absolute: 0 10*3/uL (ref 0.0–0.1)
Basophils Relative: 0 %
Eosinophils Absolute: 0.2 10*3/uL (ref 0.0–0.5)
Eosinophils Relative: 2 %
HCT: 40.6 % (ref 36.0–46.0)
Hemoglobin: 13.4 g/dL (ref 12.0–15.0)
Immature Granulocytes: 0 %
Lymphocytes Relative: 27 %
Lymphs Abs: 2.4 10*3/uL (ref 0.7–4.0)
MCH: 29.7 pg (ref 26.0–34.0)
MCHC: 33 g/dL (ref 30.0–36.0)
MCV: 90 fL (ref 80.0–100.0)
Monocytes Absolute: 0.5 10*3/uL (ref 0.1–1.0)
Monocytes Relative: 6 %
Neutro Abs: 6 10*3/uL (ref 1.7–7.7)
Neutrophils Relative %: 65 %
Platelets: 240 10*3/uL (ref 150–400)
RBC: 4.51 MIL/uL (ref 3.87–5.11)
RDW: 12.5 % (ref 11.5–15.5)
WBC: 9.2 10*3/uL (ref 4.0–10.5)
nRBC: 0 % (ref 0.0–0.2)

## 2019-08-25 LAB — BRAIN NATRIURETIC PEPTIDE: B Natriuretic Peptide: 12 pg/mL (ref 0.0–100.0)

## 2019-08-25 LAB — FIBRIN DERIVATIVES D-DIMER (ARMC ONLY): Fibrin derivatives D-dimer (ARMC): 168.67 ng/mL (FEU) (ref 0.00–499.00)

## 2019-08-25 MED ORDER — FLUTICASONE-SALMETEROL 113-14 MCG/ACT IN AEPB: 1.0000 | INHALATION_SPRAY | Freq: Two times a day (BID) | RESPIRATORY_TRACT | 10 refills | Status: DC

## 2019-08-25 NOTE — Progress Notes (Signed)
Chest x-ray looks good.  No further recommendations.Restart using your inhalers for management of your asthma.Keep follow-up with our office to ensure symptoms are improving.  Lab work also looks okay.Elisha Headland, FNP

## 2019-08-25 NOTE — Assessment & Plan Note (Signed)
Plan:  Chest xray today  Lab work

## 2019-08-25 NOTE — Assessment & Plan Note (Signed)
Plan:  [employer] Duo Chest xray today  Lab work today

## 2019-08-25 NOTE — Progress Notes (Signed)
@Patient  ID: , female    DOB: 29-Feb-1981, 39 y.o.   MRN: [id number]  Chief Complaint  Patient presents with  . Follow-up    pt reports of non prod cough, increased sob with exertion, chest tightness and wheezing.    Referring provider: [id number], MD  HPI:  39 year old female never smoker followed in our office for asthma  PMH: Status post COVID-19 infection August/2020, anxiety, depression, chronic abdominal pain, obesity Smoker/ Smoking History: Never smoker Maintenance: Air duo inhaler Pt of: Patient of Dr. September/2020  08/25/2019  - Visit   39 year old female never smoker followed in our office for asthma. Patient was last seen in our office in September/2020 for mild intermittent asthma exacerbation.  At that point time she was treated with steroids as well as a Z-Pak.  She is also encouraged to be maintained on her CPAP.  She was status post a recent COVID-19 infection in August/2020.  Patient reports that she ran out of her air duo inhaler about 10 days ago.  She started to notice that 3 days ago she started to have a dry cough.  She also feels that she has been having some intermittent lower extremity leg pain.  She reports that she has had clots in her legs before when she was pregnant.  She reports that this was over 20 years ago.  She is unsure if she was treated with blood thinners.  She reports that she was "given a shot in the hospital because of her blood clots".  Patient reports that her friends who work in healthcare reported that she sound like she is wheezing and that she may need to be evaluated for having clots.  She is very anxious regarding the potential of having a blood clot in her legs.  Unfortunately she did not contact our office for refills of her medications as she is been without her and air duo inhaler for 10 days.  Tests:   02/01/18 PSG: Overall AHI 1.8/hour.  Supine position AHI 43/hour  07/25/18 Home PSG: AHI 14/hr. Autoset CPAP  recommended 5-20 cm H2O  10/14/18 PFTs: FVC: 2.92 > 3.04 L (91 > 95 %pred), FEV1: 2.39 > 2.47 L (89 > 93 %pred), FEV1/FVC: 82%, TLC: 4.66 L (104 %pred), DLCO 112 %pred  02/21/2019-CBC with differential-eosinophils relative 1.3, eosinophils absolute 0.1  September/20 12/2018-chest x-ray-no active cardiopulmonary disease Next  FENO:  No results found for: NITRICOXIDE  PFT: No flowsheet data found.  WALK:  No flowsheet data found.  Imaging: No results found.  Lab Results:  CBC    Component Value Date/Time   WBC 8.0 02/21/2019 1550   RBC 4.70 02/21/2019 1550   HGB 14.2 02/21/2019 1550   HGB 13.2 04/01/2014 0131   HCT 42.3 02/21/2019 1550   HCT 40.3 04/01/2014 0131   PLT 215.0 02/21/2019 1550   PLT 195 04/01/2014 0131   MCV 90.0 02/21/2019 1550   MCV 90 04/01/2014 0131   MCH 30.7 05/07/2016 2027   MCHC 33.7 02/21/2019 1550   RDW 13.4 02/21/2019 1550   RDW 12.8 04/01/2014 0131   LYMPHSABS 2.0 02/21/2019 1550   MONOABS 0.4 02/21/2019 1550   EOSABS 0.1 02/21/2019 1550   BASOSABS 0.1 02/21/2019 1550    BMET    Component Value Date/Time   NA 140 02/21/2019 1550   NA 142 04/01/2014 0131   K 4.0 02/21/2019 1550   K 3.8 04/01/2014 0131   CL 104 02/21/2019 1550   CL 105 04/01/2014  0131   CO2 28 02/21/2019 1550   CO2 28 04/01/2014 0131   GLUCOSE 92 02/21/2019 1550   GLUCOSE 111 (H) 04/01/2014 0131   BUN 12 02/21/2019 1550   BUN 15 04/01/2014 0131   CREATININE 0.78 02/21/2019 1550   CREATININE 0.95 04/01/2014 0131   CALCIUM 8.7 02/21/2019 1550   CALCIUM 8.5 04/01/2014 0131   GFRNONAA >60 05/07/2016 2027   GFRNONAA >60 04/01/2014 0131   GFRAA >60 05/07/2016 2027   GFRAA >60 04/01/2014 0131    BNP No results found for: BNP  ProBNP No results found for: PROBNP  Specialty Problems      Pulmonary Problems   Asthma   Snoring   Mild intermittent asthma with exacerbation   SOB (shortness of breath)      Allergies  Allergen Reactions  . Iodinated  Diagnostic Agents Hives  . Iodine Swelling  . Sulfur Hives    Immunization History  Administered Date(s) Administered  . Influenza,inj,Quad PF,6+ Mos 05/23/2018  . Influenza-Unspecified 06/13/2019  . PPD Test 01/02/2014    Past Medical History:  Diagnosis Date  . Acne   . Asthma     Tobacco History: Social History   Tobacco Use  Smoking Status Never Smoker  Smokeless Tobacco Never Used   Counseling given: Not Answered  Continue to not smoke  Outpatient Encounter Medications as of 08/25/2019  Medication Sig  . albuterol (PROVENTIL HFA;VENTOLIN HFA) 108 (90 Base) MCG/ACT inhaler Inhale 2 puffs into the lungs every 6 (six) hours as needed for wheezing or shortness of breath.  . [DISCONTINUED] azithromycin (ZITHROMAX Z-PAK) 250 MG tablet Take 2 tablets on Day 1 and then 1 tablet daily till gone.  . [DISCONTINUED] predniSONE (DELTASONE) 20 MG tablet Take 2 tablets (40 mg total) by mouth daily with breakfast. 10 days  . Fluticasone-Salmeterol (AIRDUO RESPICLICK 113/14) 113-14 MCG/ACT AEPB Inhale 1 puff into the lungs 2 (two) times daily.  . [DISCONTINUED] Fluticasone-Salmeterol (AIRDUO RESPICLICK 113/14) 113-14 MCG/ACT AEPB Inhale 1 puff into the lungs 2 (two) times daily. (Patient not taking: Reported on 08/25/2019)   No facility-administered encounter medications on file as of 08/25/2019.     Review of Systems  Review of Systems  Constitutional: Positive for fatigue. Negative for activity change and fever.  HENT: Negative for sinus pressure, sinus pain and sore throat.   Respiratory: Positive for cough and shortness of breath. Negative for wheezing.   Cardiovascular: Negative for chest pain and palpitations.  Gastrointestinal: Negative for diarrhea, nausea and vomiting.  Musculoskeletal: Negative for arthralgias.  Neurological: Negative for dizziness.  Psychiatric/Behavioral: Negative for sleep disturbance. The patient is nervous/anxious.      Physical Exam  BP 126/74  (BP Location: Left Arm, Cuff Size: Normal)   Pulse 87   Temp (!) 97.2 F (36.2 C) (Temporal)   Ht 5' (1.524 m)   Wt 188 lb 6.4 oz (85.5 kg)   SpO2 100%   BMI 36.79 kg/m   Wt Readings from Last 5 Encounters:  08/25/19 188 lb 6.4 oz (85.5 kg)  05/13/19 195 lb 12.8 oz (88.8 kg)  02/21/19 196 lb (88.9 kg)  10/22/18 195 lb 9.6 oz (88.7 kg)  09/03/18 191 lb 12.8 oz (87 kg)    BMI Readings from Last 5 Encounters:  08/25/19 36.79 kg/m  05/13/19 38.24 kg/m  02/21/19 38.28 kg/m  10/22/18 38.20 kg/m  09/03/18 37.46 kg/m     Physical Exam Vitals and nursing note reviewed.  Constitutional:      General: She is  not in acute distress.    Appearance: Normal appearance.  HENT:     Head: Normocephalic and atraumatic.     Right Ear: External ear normal. There is no impacted cerumen.     Left Ear: External ear normal. There is no impacted cerumen.     Nose: Nose normal. No congestion.     Mouth/Throat:     Mouth: Mucous membranes are moist.     Pharynx: Oropharynx is clear.  Eyes:     Pupils: Pupils are equal, round, and reactive to light.  Cardiovascular:     Rate and Rhythm: Normal rate and regular rhythm.     Pulses: Normal pulses.     Heart sounds: Normal heart sounds. No murmur.  Pulmonary:     Effort: Pulmonary effort is normal. No respiratory distress.     Breath sounds: Normal breath sounds. No decreased air movement. No decreased breath sounds, wheezing or rales.  Musculoskeletal:        General: No swelling or tenderness.     Cervical back: Normal range of motion.     Right lower leg: No edema.     Left lower leg: No edema.  Skin:    General: Skin is warm and dry.     Capillary Refill: Capillary refill takes less than 2 seconds.  Neurological:     General: No focal deficit present.     Mental Status: She is alert and oriented to person, place, and time. Mental status is at baseline.     Gait: Gait normal.  Psychiatric:        Mood and Affect: Mood is anxious.         Speech: Speech is rapid and pressured.        Behavior: Behavior is hyperactive.        Thought Content: Thought content normal.        Judgment: Judgment normal.      Assessment & Plan:   Mild intermittent asthma with exacerbation Plan:  Insurance account manager Duo Chest xray today  Lab work today    Asthma Plan:  Restart air duo    COVID-19 virus infection Plan:  Chest xray today  Lab work   SOB (shortness of breath) Unlikely PE / DVT  Doubt vte history   Discussion: Likely believe patient's VTE history 20 years ago status post pregnancy was prophylaxis.  She reports that she was given a shot while she was in the hospital she does not believe she had any sort of formal anticoagulation after discharge.  This sounds like patient was given DVT prophylaxis versus DVT treatment.  Patient's lower extremities today are equal bilaterally, nontender to the touch.  Slight trace swelling.  I suspect this is the patient's baseline.  Patient's vital signs are stable today.  Out of abundance of caution as well as patient's anxiety regarding her leg pain we will order lab work to further evaluate.  I explained to the patient know that I do not believe that she is having a pulmonary embolism or a blood clot.  I believe there is a high likelihood that a large component of this is the patient being off of her inhalers for 10 days and anxiety.  I did review with the patient that if the D-dimer is elevated we will need to obtain additional imaging and work-up.  She reports she understands.  Plan:  Lab work today  Chest xray     Return in about 2 weeks (around 09/08/2019), or if  symptoms worsen or fail to improve, for [facility] - Dr. Belia Heman.   Coral Ceo, NP 08/25/2019   This appointment required 32 minutes of patient care (this includes precharting, chart review, review of results, face-to-face care, etc.).

## 2019-08-25 NOTE — Progress Notes (Signed)
D-dimer is negative.  Start inhaler as discussed.  Keep follow-up with our office.Elisha Headland, FNP

## 2019-08-25 NOTE — Assessment & Plan Note (Signed)
Plan:  Restart air duo

## 2019-08-25 NOTE — Assessment & Plan Note (Addendum)
Unlikely PE / DVT  Doubt vte history   Discussion: Likely believe patient's VTE history 20 years ago status post pregnancy was prophylaxis.  She reports that she was given a shot while she was in the hospital she does not believe she had any sort of formal anticoagulation after discharge.  This sounds like patient was given DVT prophylaxis versus DVT treatment.  Patient's lower extremities today are equal bilaterally, nontender to the touch.  Slight trace swelling.  I suspect this is the patient's baseline.  Patient's vital signs are stable today.  Out of abundance of caution as well as patient's anxiety regarding her leg pain we will order lab work to further evaluate.  I explained to the patient know that I do not believe that she is having a pulmonary embolism or a blood clot.  I believe there is a high likelihood that a large component of this is the patient being off of her inhalers for 10 days and anxiety.  I did review with the patient that if the D-dimer is elevated we will need to obtain additional imaging and work-up.  She reports she understands.  Plan:  Lab work today  Chest xray

## 2019-08-25 NOTE — Patient Instructions (Addendum)
You were seen today by Stacy Ceo, NP  for:   1. SOB (shortness of breath)  - Basic metabolic panel; Future - CBC w/Diff; Future - D-Dimer, Quantitative; Future - CXR 2view; Future  We will perform these lab tests as well as an x-ray to further evaluate your shortness of breath  If you have an abnormal lab result we will contact you as well as may need to consider additional interventions or testing  If your shortness of breath worsens or you start struggling with your breathing please contact 911 or seek emergent care  2. Asthma   Restart your air duo inhaler  Continue to use your rescue inhaler as needed      We recommend today:  Orders Placed This Encounter  Procedures  . CXR 2view    Standing Status:   Future    Standing Expiration Date:   10/24/2020    Order Specific Question:   Reason for Exam (SYMPTOM  OR DIAGNOSIS REQUIRED)    Answer:   sob    Order Specific Question:   Preferred imaging location?    Answer:   Internal    Order Specific Question:   Is the patient pregnant?    Answer:   No  . Basic metabolic panel    Standing Status:   Future    Standing Expiration Date:   08/24/2020  . CBC w/Diff    Standing Status:   Future    Standing Expiration Date:   08/24/2020  . D-Dimer, Quantitative    Standing Status:   Future    Standing Expiration Date:   08/24/2020   Orders Placed This Encounter  Procedures  . CXR 2view  . Basic metabolic panel  . CBC w/Diff  . D-Dimer, Quantitative   Meds ordered this encounter  Medications  . Fluticasone-Salmeterol (AIRDUO RESPICLICK 113/14) 113-14 MCG/ACT AEPB    Sig: Inhale 1 puff into the lungs 2 (two) times daily.    Dispense:  1 each    Refill:  10    Follow Up:    Return in about 2 weeks (around 09/08/2019), or if symptoms worsen or fail to improve, for [facility] - Dr. Belia Heman.   Please do your part to reduce the spread of COVID-19:      Reduce your risk of any infection  and COVID19 by using the  similar precautions used for avoiding the common cold or flu:  Marland Kitchen Wash your hands often with soap and warm water for at least 20 seconds.  If soap and water are not readily available, use an alcohol-based hand sanitizer with at least 60% alcohol.  . If coughing or sneezing, cover your mouth and nose by coughing or sneezing into the elbow areas of your shirt or coat, into a tissue or into your sleeve (not your hands). Drinda Butts A MASK when in public  . Avoid shaking hands with others and consider head nods or verbal greetings only. . Avoid touching your eyes, nose, or mouth with unwashed hands.  . Avoid close contact with people who are sick. . Avoid places or events with large numbers of people in one location, like concerts or sporting events. . If you have some symptoms but not all symptoms, continue to monitor at home and seek medical attention if your symptoms worsen. . If you are having a medical emergency, call 911.   ADDITIONAL HEALTHCARE OPTIONS FOR PATIENTS  [facility] / e-Visit: [email or web address]  [facility]: [facility]: [phone]                   [facility]: [phone]     It is flu season:   >>> Best ways to protect herself from the flu: Receive the yearly flu vaccine, practice good hand hygiene washing with soap and also using hand sanitizer when available, eat a nutritious meals, get adequate rest, hydrate appropriately   Please contact the office if your symptoms worsen or you have concerns that you are not improving.   Thank you for choosing [facility] for your healthcare, and for allowing [place] to partner with you on your healthcare journey. I am thankful to be able to provide care to you today.   Wyn Quaker FNP-C

## 2019-09-08 ENCOUNTER — Ambulatory Visit (INDEPENDENT_AMBULATORY_CARE_PROVIDER_SITE_OTHER): Payer: Federal, State, Local not specified - PPO | Admitting: Internal Medicine

## 2019-09-08 ENCOUNTER — Encounter: Payer: Self-pay | Admitting: Internal Medicine

## 2019-09-08 DIAGNOSIS — J4540 Moderate persistent asthma, uncomplicated: Secondary | ICD-10-CM

## 2019-09-08 DIAGNOSIS — G4733 Obstructive sleep apnea (adult) (pediatric): Secondary | ICD-10-CM | POA: Diagnosis not present

## 2019-09-08 MED ORDER — PREDNISONE 20 MG PO TABS: 20.0000 mg | ORAL_TABLET | Freq: Every day | ORAL | 1 refills | Status: DC

## 2019-09-08 NOTE — Patient Instructions (Addendum)
Prednisone 20 mg daily for 10 days Continue inhalers as prescribed Albuterol as needed Avoid secondhand smoke Continue CPAP as prescribed

## 2019-09-08 NOTE — Progress Notes (Signed)
PULMONARY OFFICE FOLLOW UP NOTE   I connected with the patient by telephone enabled telemedicine visit and verified that I am speaking with the correct person using two identifiers.    I discussed the limitations, risks, security and privacy concerns of performing an evaluation and management service by telemedicine and the availability of in-person appointments. I also discussed with the patient that there may be a patient responsible charge related to this service. The patient expressed understanding and agreed to proceed.  PATIENT AGREES AND CONFIRMS -YES   [name], nursing  This visit type was conducted due to national recommendations for restrictions regarding the COVID-19 Pandemic (e.g. social distancing).  This format is felt to be most appropriate for this patient at this time.  All issues noted in this document were discussed and addressed.         Requesting MD/Service: Self Date of initial consultation: 07/23/2018 Reason for consultation: Asthma  PT PROFILE: 39 y.o. female never smoker diagnosed with asthma as a child self-referred for evaluation of persistent asthma symptoms and exertional dyspnea.  DATA: 02/01/18 PSG: Overall AHI 1.8/hour.  Supine position AHI 43/hour 07/25/18 Home PSG: AHI 14/hr. Autoset CPAP recommended 5-20 cm H2O 10/14/18 PFTs: FVC: 2.92 > 3.04 L (91 > 95 %pred), FEV1: 2.39 > 2.47 L (89 > 93 %pred), FEV1/FVC: 82%, TLC: 4.66 L (104 %pred), DLCO 112 %pred    INTERVAL: Last visit 09/03/2018.  No major events in the interim   CC Follow up ASTHMA Follow up OSA   HPI  ASTHMA-seems to be under control now but has some wheezing Refilled Dulera Albuterol as needed 03/2019 COVID 19 infection   OSA -tolerating CPAP Excessive daytime sleepiness resolved Uses and benefits from therapy   No  exacerbation at this time No evidence of heart failure at this time No evidence or  signs of infection at this time No respiratory distress No fevers, chills, nausea, vomiting, diarrhea No evidence of lower extremity edema No evidence hemoptysis      Review of Systems:  Gen:  Denies  fever, sweats, chills weight loss  HEENT: Denies blurred vision, double vision, ear pain, eye pain, hearing loss, nose bleeds, sore throat Cardiac:  No dizziness, chest pain or heaviness, -chest tightness,edema, No JVD Resp:   +cough, -sputum production, +shortness of breath,+wheezing, -hemoptysis,  Gi: Denies swallowing difficulty, stomach pain, nausea or vomiting, diarrhea, constipation, bowel incontinence Gu:  Denies bladder incontinence, burning urine Ext:   Denies Joint pain, stiffness or swelling Skin: Denies  skin rash, easy bruising or bleeding or hives Endoc:  Denies polyuria, polydipsia , polyphagia or weight change Psych:   Denies depression, insomnia or hallucinations  Other:  All other systems negative        CXR: No new film  I have personally reviewed all chest radiographs reported above including CXRs and CT chest unless otherwise indicated  39 year old pleasant white female with moderate persistent asthma with recent COVID-19 infection in the setting of underlying sleep apnea and obesity  Moderate asthma Still has some wheezing We will start prednisone 20 mg daily for 10 days No need for antibiotics at this time Continue inhalers as prescribed Albuterol as needed Avoid secondhand smoke   OSA Continue CPAP as prescribed She uses a benefits from CPAP therapy Excessive daytime sleepiness has resolved   Obesity -recommend significant weight loss -recommend changing diet  Deconditioned state -Recommend increased daily activity and exercise  COVID-19 EDUCATION: The signs and symptoms of COVID-19 were discussed with the patient and how to seek care for testing.  The importance of social distancing was discussed today. Hand Washing Techniques and  avoid touching face was advised.   MEDICATION ADJUSTMENTS/LABS AND TESTS ORDERED: Prednisone 20 mg daily for 10 days Continue inhalers as prescribed Albuterol as needed Avoid secondhand smoke Continue CPAP as prescribed   CURRENT MEDICATIONS REVIEWED AT LENGTH WITH PATIENT TODAY   Patient satisfied with Plan of action and management. All questions answered  Follow up in 6 months Total time spent 23 mins  Stacy Yang, M.D.  [facility]  Medical Director [facility] Medical Director [facility] Cardio-Pulmonary Department

## 2019-09-27 ENCOUNTER — Encounter: Payer: Self-pay | Admitting: Family Medicine

## 2019-09-27 ENCOUNTER — Other Ambulatory Visit: Payer: Self-pay

## 2019-09-27 ENCOUNTER — Ambulatory Visit (INDEPENDENT_AMBULATORY_CARE_PROVIDER_SITE_OTHER): Payer: Federal, State, Local not specified - PPO | Admitting: Family Medicine

## 2019-09-27 VITALS — Ht 60.0 in | Wt 179.0 lb

## 2019-09-27 DIAGNOSIS — Z800 Family history of malignant neoplasm of digestive organs: Secondary | ICD-10-CM | POA: Diagnosis not present

## 2019-09-27 DIAGNOSIS — R634 Abnormal weight loss: Secondary | ICD-10-CM | POA: Diagnosis not present

## 2019-09-27 DIAGNOSIS — K921 Melena: Secondary | ICD-10-CM | POA: Insufficient documentation

## 2019-09-27 DIAGNOSIS — Z1231 Encounter for screening mammogram for malignant neoplasm of breast: Secondary | ICD-10-CM

## 2019-09-27 NOTE — Assessment & Plan Note (Signed)
She has multiple family members on her father's side with a history of pancreatic cancer.  Discussed referral to genetic counselor to determine if she needs any genetic testing.

## 2019-09-27 NOTE — Assessment & Plan Note (Signed)
Refer to GI to consider colonoscopy. 

## 2019-09-27 NOTE — Assessment & Plan Note (Addendum)
Likely related to intentional changes with diet and exercise though we will obtain lab work to rule out other causes.  She will call to schedule a mammogram.  Will refer her to GI for the blood in her stool.  She will contact her GYN to schedule an appointment for Pap smear and to discuss her multiple menstrual cycles.

## 2019-09-27 NOTE — Patient Instructions (Signed)
Please call [phone] to schedule your mammogram. 

## 2019-09-27 NOTE — Progress Notes (Signed)
Virtual Visit via video Note  This visit type was conducted due to national recommendations for restrictions regarding the COVID-19 pandemic (e.g. social distancing).  This format is felt to be most appropriate for this patient at this time.  All issues noted in this document were discussed and addressed.  No physical exam was performed (except for noted visual exam findings with Video Visits).   I connected with Stacy Yang today at 11:30 AM EST by a video enabled telemedicine application or telephone and verified that I am speaking with the correct person using two identifiers. Location patient: work Location provider: home office Persons participating in the virtual visit: patient, provider  I discussed the limitations, risks, security and privacy concerns of performing an evaluation and management service by telephone and the availability of in person appointments. I also discussed with the patient that there may be a patient responsible charge related to this service. The patient expressed understanding and agreed to proceed.   Reason for visit: Same day visit.  HPI: Weight loss: Patient notes she has been trying to lose weight.  She started going to the gym 3 or 4 weeks ago and started to make dietary changes as well with cutting back on carbohydrates and not eating very late.  She has been decreasing her calorie intake.  She weighed 199 pounds at the end of December and weighs 179 pounds now.  She feels as though she has lost too much weight too quickly.  She is going to the gym 3 days a week.  No abdominal pain, vomiting, diarrhea, nausea, palpitations, or appetite suppression.  She does note a little blood in her stool at times.  Notes its not drastic.  No family history of colon cancer though she does note her GI physician wanted to do a colonoscopy previously though the patient's insurance would not cover it.  She does report her dad has lung cancer.  She reports her maternal  grandfather and 2 paternal uncles both had pancreatic cancer.  Her last Pap smear was 2 years ago.  She has never had a mammogram.  She does note last month she had 2 periods with a menstrual cycle 2 weeks apart.  She had some sweating when she was on her menstrual cycle.   ROS: See pertinent positives and negatives per HPI.  Past Medical History:  Diagnosis Date  . Acne   . Asthma     Past Surgical History:  Procedure Laterality Date  . CESAREAN SECTION     x3  . ESOPHAGOGASTRODUODENOSCOPY (EGD) WITH PROPOFOL N/A 07/15/2017   Procedure: ESOPHAGOGASTRODUODENOSCOPY (EGD) WITH PROPOFOL;  Surgeon: Toney Reil, MD;  Location: [facility];  Service: Endoscopy;  Laterality: N/A;  . TUBAL LIGATION      Family History  Problem Relation Age of Onset  . Kidney disease Mother   . Alcohol abuse Father   . Diabetes Maternal Aunt   . Diabetes Maternal Grandmother   . Lung cancer Maternal Grandfather   . Cirrhosis Paternal Grandfather     SOCIAL HX: Non-smoker   Current Outpatient Medications:  .  albuterol (PROVENTIL HFA;VENTOLIN HFA) 108 (90 Base) MCG/ACT inhaler, Inhale 2 puffs into the lungs every 6 (six) hours as needed for wheezing or shortness of breath., Disp: 2 Inhaler, Rfl: 2 .  Fluticasone-Salmeterol (AIRDUO RESPICLICK 113/14) 113-14 MCG/ACT AEPB, Inhale 1 puff into the lungs 2 (two) times daily., Disp: 1 each, Rfl: 10  EXAM:  VITALS per patient if applicable:  GENERAL: alert,  oriented, appears well and in no acute distress  HEENT: atraumatic, conjunttiva clear, no obvious abnormalities on inspection of external nose and ears  NECK: normal movements of the head and neck  LUNGS: on inspection no signs of respiratory distress, breathing rate appears normal, no obvious gross SOB, gasping or wheezing  CV: no obvious cyanosis  MS: moves all visible extremities without noticeable abnormality  PSYCH/NEURO: pleasant and cooperative, no obvious depression or  anxiety, speech and thought processing grossly intact  ASSESSMENT AND PLAN:  Discussed the following assessment and plan:  Weight loss Likely related to intentional changes with diet and exercise though we will obtain lab work to rule out other causes.  She will call to schedule a mammogram.  Will refer her to GI for the blood in her stool.  She will contact her GYN to schedule an appointment for Pap smear and to discuss her multiple menstrual cycles.  Blood in stool Refer to GI to consider colonoscopy.  Family history of pancreatic cancer She has multiple family members on her father's side with a history of pancreatic cancer.  Discussed referral to genetic counselor to determine if she needs any genetic testing.   Orders Placed This Encounter  Procedures  . MM 3D SCREEN BREAST BILATERAL    Standing Status:   Future    Standing Expiration Date:   11/24/2020    Order Specific Question:   Reason for Exam (SYMPTOM  OR DIAGNOSIS REQUIRED)    Answer:   breast cancer screening    Order Specific Question:   Is the patient pregnant?    Answer:   No    Order Specific Question:   Preferred imaging location?    Answer:   [facility]  . TSH    Standing Status:   Future    Standing Expiration Date:   09/26/2020  . Hepatic function panel    Standing Status:   Future    Standing Expiration Date:   09/26/2020  . Hemoglobin A1c    Standing Status:   Future    Standing Expiration Date:   09/26/2020  . Sedimentation rate    Standing Status:   Future    Standing Expiration Date:   09/26/2020  . Ambulatory referral to Genetics    Referral Priority:   Routine    Referral Type:   Consultation    Referral Reason:   Specialty Services Required    Number of Visits Requested:   1  . Ambulatory referral to Gastroenterology    Referral Priority:   Routine    Referral Type:   Consultation    Referral Reason:   Specialty Services Required    Number of Visits Requested:   1    No orders of the  defined types were placed in this encounter.    I discussed the assessment and treatment plan with the patient. The patient was provided an opportunity to ask questions and all were answered. The patient agreed with the plan and demonstrated an understanding of the instructions.   The patient was advised to call back or seek an in-person evaluation if the symptoms worsen or if the condition fails to improve as anticipated.   Tommi Rumps, MD

## 2019-10-03 ENCOUNTER — Ambulatory Visit: Payer: Federal, State, Local not specified - PPO | Admitting: Family Medicine

## 2019-10-05 ENCOUNTER — Telehealth: Payer: Self-pay | Admitting: Lab

## 2019-10-05 NOTE — Telephone Encounter (Signed)
Called Pt No answer, left VM to call office. Was calling Pt to reschedule Lab appt  Tomorrow, due to office closing because of  bad weather.

## 2019-10-06 ENCOUNTER — Other Ambulatory Visit: Payer: Federal, State, Local not specified - PPO

## 2019-10-10 ENCOUNTER — Other Ambulatory Visit (INDEPENDENT_AMBULATORY_CARE_PROVIDER_SITE_OTHER): Payer: Federal, State, Local not specified - PPO

## 2019-10-10 ENCOUNTER — Other Ambulatory Visit: Payer: Self-pay

## 2019-10-10 DIAGNOSIS — R634 Abnormal weight loss: Secondary | ICD-10-CM | POA: Diagnosis not present

## 2019-10-11 DIAGNOSIS — G4733 Obstructive sleep apnea (adult) (pediatric): Secondary | ICD-10-CM | POA: Diagnosis not present

## 2019-10-11 LAB — TSH: TSH: 1.53 u[IU]/mL (ref 0.35–4.50)

## 2019-10-11 LAB — HEPATIC FUNCTION PANEL
ALT: 13 U/L (ref 0–35)
AST: 14 U/L (ref 0–37)
Albumin: 4.2 g/dL (ref 3.5–5.2)
Alkaline Phosphatase: 65 U/L (ref 39–117)
Bilirubin, Direct: 0.1 mg/dL (ref 0.0–0.3)
Total Bilirubin: 0.7 mg/dL (ref 0.2–1.2)
Total Protein: 7 g/dL (ref 6.0–8.3)

## 2019-10-11 LAB — HEMOGLOBIN A1C: Hgb A1c MFr Bld: 5.5 % (ref 4.6–6.5)

## 2019-10-11 LAB — SEDIMENTATION RATE: Sed Rate: 14 mm/hr (ref 0–20)

## 2019-10-13 ENCOUNTER — Encounter: Payer: Self-pay | Admitting: Licensed Clinical Social Worker

## 2019-10-13 ENCOUNTER — Inpatient Hospital Stay: Payer: Federal, State, Local not specified - PPO | Attending: Oncology | Admitting: Licensed Clinical Social Worker

## 2019-10-13 ENCOUNTER — Other Ambulatory Visit: Payer: Self-pay

## 2019-10-13 DIAGNOSIS — Z801 Family history of malignant neoplasm of trachea, bronchus and lung: Secondary | ICD-10-CM

## 2019-10-13 DIAGNOSIS — Z800 Family history of malignant neoplasm of digestive organs: Secondary | ICD-10-CM

## 2019-10-13 NOTE — Progress Notes (Signed)
REFERRING PROVIDER: Leone Haven, MD [address] [place],  [place] [address]  PRIMARY PROVIDER:  Leone Haven, MD  PRIMARY REASON FOR VISIT:  1. Family history of pancreatic cancer   2. Family history of lung cancer    I connected with Stacy Yang on 10/13/2019 at 9:55 AM EDT by Webex and verified that I am speaking with the correct person using two identifiers.    Patient location:work Provider location: office   HISTORY OF PRESENT ILLNESS:   Stacy Yang, a 39 y.o. female, was seen for a [place] cancer genetics consultation at the request of Dr. Caryl Bis due to a family history of pancreatic cancer.  Stacy Yang presents to clinic today to discuss the possibility of a hereditary predisposition to cancer, genetic testing, and to further clarify her future cancer risks, as well as potential cancer risks for family members.   Stacy Yang is a 39 y.o. female with no personal history of cancer.    CANCER HISTORY:  Oncology History   No history exists.     RISK FACTORS:  Menarche was at age 14.  First live birth at age 73.  OCP use: yes Ovaries intact: yes.  Hysterectomy: no.  Menopausal status: premenopausal.  HRT use: 0 years. Colonoscopy: yes; reports 1 cscope in her 74s that was inconclusive. Mammogram within the last year: no. Number of breast biopsies: 0. Up to date with pelvic exams: no. 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 Acne   . Asthma   . Family history of lung cancer   . Family history of pancreatic cancer     Past Surgical History:  Procedure Laterality Date  . CESAREAN SECTION     x3  . ESOPHAGOGASTRODUODENOSCOPY (EGD) WITH PROPOFOL N/A 07/15/2017   Procedure: ESOPHAGOGASTRODUODENOSCOPY (EGD) WITH PROPOFOL;  Surgeon: Lin Landsman, MD;  Location: [facility];  Service: Endoscopy;  Laterality: N/A;  . TUBAL  LIGATION      Social History   Socioeconomic History  . Marital status: Divorced    Spouse name: Not on file  . Number of children: Not on file  . Years of education: Not on file  . Highest education level: Not on file  Occupational History  . Not on file  Tobacco Use  . Smoking status: Never Smoker  . Smokeless tobacco: Never Used  Substance and Sexual Activity  . Alcohol use: No  . Drug use: No  . Sexual activity: Yes  Other Topics Concern  . Not on file  Social History Narrative  . Not on file   Social Determinants of Health   Financial Resource Strain:   . Difficulty of Paying Living Expenses: Not on file  Food Insecurity:   . Worried About Charity fundraiser in the Last Year: Not on file  . Ran Out of Food in the Last Year: Not on file  Transportation Needs:   . Lack of Transportation (Medical): Not on file  . Lack of Transportation (Non-Medical): Not on file  Physical Activity:   . Days of Exercise per Week: Not on file  . Minutes of Exercise per Session: Not on file  Stress:   . Feeling of Stress : Not on file  Social Connections:   . Frequency of Communication with Friends and Family: Not on file  . Frequency of Social Gatherings with Friends and  Family: Not on file  . Attends Religious Services: Not on file  . Active Member of Clubs or Organizations: Not on file  . Attends Archivist Meetings: Not on file  . Marital Status: Not on file     FAMILY HISTORY:  We obtained a detailed, 4-generation family history.  Significant diagnoses are listed below: Family History  Problem Relation Age of Onset  . Kidney disease Mother   . Alcohol abuse Father   . Lung cancer Father   . Diabetes Maternal Aunt   . Diabetes Maternal Grandmother   . Cirrhosis Paternal Grandfather   . Pancreatic cancer Paternal Grandfather   . Pancreatic cancer Paternal Uncle   . Dementia Paternal Grandmother    Stacy Yang has 2 daughters and 1 son, no cancers. She has 1  sister, 30, with no history of cancer.  Stacy Yang mother is living at 101 with no history of cancer. Patient has 2 maternal aunts, 1 maternal uncle, no cancers. She has 1 maternal first cousin, no cancers. Maternal grandfather passed at 37 and grandmother passed in her 58s.  Stacy Yang father is living at 37 and was recently diagnosed with lung cancer. He also has had skin cancer in the past. Patient had 4 paternal uncles and 2 paternal aunts. One of her uncles had pancreatic cancer in his 31s. She does not have information about paternal cousins, limited info in general on this side. Paternal grandmother passed due to dementia in her 10s and paternal grandfather also had pancreatic cancer or possibly liver cancer, diagnosed over the age of 31.   Stacy Yang is unaware of previous family history of genetic testing for hereditary cancer risks. Patient's ancestors are of [place] and [place] descent.There is no reported Ashkenazi Jewish ancestry. There is no known consanguinity.  GENETIC COUNSELING ASSESSMENT: Stacy Yang is a 39 y.o. female with a family history of pancreatic cancer which is somewhat suggestive of a hereditary cancer syndrome and predisposition to cancer. We, therefore, discussed and recommended the following at today's visit.   DISCUSSION: We discussed that 5 - 10% of pancreatic cancer is hereditary, with most cases associated with BRCA1/BRCA2 mutations.  There are other genes that can be associated with hereditary pancreatic cancer syndromes. We discussed that testing is beneficial for several reasons including knowing how to follow individuals for cancer screenings, and understand if other family members could be at risk for cancer and allow them to undergo genetic testing.   We reviewed the characteristics, features and inheritance patterns of hereditary cancer syndromes. We also discussed genetic testing, including the appropriate family members to test, the process of testing, insurance  coverage and turn-around-time for results. We discussed the implications of a negative, positive and/or variant of uncertain significant result. We recommended Stacy Yang pursue genetic testing for the Common Hereditary Cancers gene panel.   The Common Hereditary Cancers Panel offered by [facility] includes sequencing and/or deletion duplication testing of the following 48 genes: APC, ATM, AXIN2, BARD1, BMPR1A, BRCA1, BRCA2, BRIP1, CDH1, CDKN2A (p14ARF), CDKN2A (p16INK4a), CKD4, CHEK2, CTNNA1, DICER1, EPCAM (Deletion/duplication testing only), GREM1 (promoter region deletion/duplication testing only), KIT, MEN1, MLH1, MSH2, MSH3, MSH6, MUTYH, NBN, NF1, NHTL1, PALB2, PDGFRA, PMS2, POLD1, POLE, PTEN, RAD50, RAD51C, RAD51D, RNF43, SDHB, SDHC, SDHD, SMAD4, SMARCA4. STK11, TP53, TSC1, TSC2, and VHL.  The following genes were evaluated for sequence changes only: SDHA and HOXB13 c.251G>A variant only.  Based on Stacy Yang family history of cancer, she meets medical criteria for genetic testing. Despite that  she meets criteria, she may still have an out of pocket cost.  PLAN: After considering the risks, benefits, and limitations, Stacy Yang provided informed consent to pursue genetic testing. A saliva kit was mailed to her home and the sample will be sent to [facility] for analysis of the Common Hereditary Cancers Panel. Results should be available within approximately 2-3 weeks' time, at which point they will be disclosed by telephone to Stacy Yang, as will any additional recommendations warranted by these results. Stacy Yang will receive a summary of her genetic counseling visit and a copy of her results once available. This information will also be available in Epic.   Stacy Yang questions were answered to her satisfaction today. Our contact information was provided should additional questions or concerns arise. Thank you for the referral and allowing [place] to share in the care of your patient.   Faith Rogue, MS, [facility] Genetic Counselor [email or web address] Phone: [phone]  The patient was seen for a total of 30 minutes in face-to-face genetic counseling.  Drs. Magrinat, Lindi Adie and/or Burr Medico were available for discussion regarding this case.   _______________________________________________________________________ For Office Staff:  Number of people involved in session: 1 Was an Intern/ student involved with case: no

## 2019-10-20 ENCOUNTER — Other Ambulatory Visit: Payer: Self-pay

## 2019-10-20 ENCOUNTER — Encounter: Payer: Self-pay | Admitting: Gastroenterology

## 2019-10-20 ENCOUNTER — Ambulatory Visit (INDEPENDENT_AMBULATORY_CARE_PROVIDER_SITE_OTHER): Payer: Federal, State, Local not specified - PPO | Admitting: Gastroenterology

## 2019-10-20 ENCOUNTER — Telehealth: Payer: Self-pay

## 2019-10-20 VITALS — BP 111/77 | HR 65 | Temp 97.8°F | Wt 182.5 lb

## 2019-10-20 DIAGNOSIS — K625 Hemorrhage of anus and rectum: Secondary | ICD-10-CM | POA: Diagnosis not present

## 2019-10-20 DIAGNOSIS — R1030 Lower abdominal pain, unspecified: Secondary | ICD-10-CM

## 2019-10-20 DIAGNOSIS — R109 Unspecified abdominal pain: Secondary | ICD-10-CM

## 2019-10-20 DIAGNOSIS — G8929 Other chronic pain: Secondary | ICD-10-CM | POA: Diagnosis not present

## 2019-10-20 MED ORDER — SUTAB 1479-225-188 MG PO TABS: 12.0000 | ORAL_TABLET | Freq: Two times a day (BID) | ORAL | 0 refills | Status: DC

## 2019-10-20 NOTE — Progress Notes (Signed)
Cephas Darby, MD [address]  [place]  [place],  [address]  Main: [phone]  Fax: [phone]    Gastroenterology Consultation  Referring Provider:     Leone Haven, MD Primary Care Physician:  Leone Haven, MD Primary Gastroenterologist:  Dr. Cephas Darby Reason for Consultation:   Rectal bleeding, lower abdominal pain        HPI:   Stacy Yang is a 39 y.o. female referred by Dr. Caryl Bis, Angela Adam, MD  for consultation & management of chronic intermittent rectal bleeding associated with lower abdominal cramps, loose stool, leakage/soiling, swelling/discomfort in the rectum, rectal pain, itching.  Patient reports that she was evaluated for Crohn's disease as a child and underwent colonoscopy which was incomplete due to poor prep.  She is interested to know whether or not she has Crohn's disease.  She is also concerned about weight loss of 20 pounds in last 2 months although she is not trying to lose weight with intense exercise.  She denies abdominal bloating.  She does have worsening of GI symptoms with increased stress.  She reports rectal bleeding on wiping only.  Her labs are otherwise unremarkable.  NSAIDs: None  Antiplts/Anticoagulants/Anti thrombotics: None  GI Procedures: EGD 07/15/2017 - Normal duodenal bulb, second portion of the duodenum and third portion of the duodenum. - Normal stomach. - Normal gastroesophageal junction and esophagus. Biopsied. - Esophagogastric landmarks identified Normal pathology  Patient does not smoke or drink alcohol, she works as a Art therapist  Past Medical History:  Diagnosis Date  . Acne   . Asthma   . Family history of lung cancer   . Family history of pancreatic cancer     Past Surgical History:  Procedure Laterality Date  . CESAREAN SECTION     x3  . ESOPHAGOGASTRODUODENOSCOPY (EGD) WITH PROPOFOL N/A 07/15/2017   Procedure: ESOPHAGOGASTRODUODENOSCOPY (EGD) WITH PROPOFOL;   Surgeon: Lin Landsman, MD;  Location: [facility];  Service: Endoscopy;  Laterality: N/A;  . TUBAL LIGATION      Current Outpatient Medications:  .  albuterol (PROVENTIL HFA;VENTOLIN HFA) 108 (90 Base) MCG/ACT inhaler, Inhale 2 puffs into the lungs every 6 (six) hours as needed for wheezing or shortness of breath., Disp: 2 Inhaler, Rfl: 2 .  Fluticasone-Salmeterol (AIRDUO RESPICLICK 948/54) 627-03 MCG/ACT AEPB, Inhale 1 puff into the lungs 2 (two) times daily., Disp: 1 each, Rfl: 10   Family History  Problem Relation Age of Onset  . Kidney disease Mother   . Alcohol abuse Father   . Lung cancer Father   . Diabetes Maternal Aunt   . Diabetes Maternal Grandmother   . Cirrhosis Paternal Grandfather   . Pancreatic cancer Paternal Grandfather   . Pancreatic cancer Paternal Uncle   . Dementia Paternal Grandmother      Social History   Tobacco Use  . Smoking status: Never Smoker  . Smokeless tobacco: Never Used  Substance Use Topics  . Alcohol use: No  . Drug use: No    Allergies as of 10/20/2019 - Review Complete 10/20/2019  Allergen Reaction Noted  . Iodinated diagnostic agents Hives 12/26/2014  . Iodine Swelling 11/04/2013  . Sulfur Hives 12/26/2014    Review of Systems:    All systems reviewed and negative except where noted in HPI.   Physical Exam:  BP 111/77 (BP Location: Left Arm, Patient Position: Sitting, Cuff Size: Normal)   Pulse 65   Temp 97.8 F (36.6 C) (Oral)  Wt 182 lb 8 oz (82.8 kg)   LMP 09/24/2019 (Approximate)   BMI 35.64 kg/m  Patient's last menstrual period was 09/24/2019 (approximate).  General:   Alert,  Well-developed, well-nourished, pleasant and cooperative in NAD Head:  Normocephalic and atraumatic. Eyes:  Sclera clear, no icterus.   Conjunctiva pink. Ears:  Normal auditory acuity. Nose:  No deformity, discharge, or lesions. Mouth:  No deformity or lesions,oropharynx pink & moist. Neck:  Supple; no masses or  thyromegaly. Lungs:  Respirations even and unlabored.  Clear throughout to auscultation.   No wheezes, crackles, or rhonchi. No acute distress. Heart:  Regular rate and rhythm; no murmurs, clicks, rubs, or gallops. Abdomen:  Normal bowel sounds. Soft, non-tender and non-distended without masses, hepatosplenomegaly or hernias noted.  No guarding or rebound tenderness.   Rectal: Not performed Msk:  Symmetrical without gross deformities. Good, equal movement & strength bilaterally. Pulses:  Normal pulses noted. Extremities:  No clubbing or edema.  No cyanosis. Neurologic:  Alert and oriented x3;  grossly normal neurologically. Skin:  Intact without significant lesions or rashes. No jaundice. Lymph Nodes:  No significant cervical adenopathy. Psych:  Alert and cooperative. Normal mood and affect.  Imaging Studies: No abdominal imaging  Assessment and Plan:   Stacy Yang is a 39 y.o. pleasant Caucasian female with BMI 35 is seen in consultation for chronic intermittent bright red blood per rectum on wiping associated with loose stool, lower abdominal cramps and unintentional weight loss.  Patient underwent EGD in 2018 which was unremarkable  Discussed about diagnostic colonoscopy with TI evaluation given her age although her symptoms are most likely secondary to symptomatic external hemorrhoids If colonoscopy is unremarkable, next step would be hemorrhoid ligation, information provided  Follow up in 6-8 weeks   Arlyss Repress, MD

## 2019-10-20 NOTE — Addendum Note (Signed)
Addended by: Radene Knee L on: 10/20/2019 03:19 PM   Modules accepted: Orders

## 2019-10-20 NOTE — Telephone Encounter (Signed)
Informed patient that covid test would be on 11/03/2019 between 10:30 to 12:30 at the [facility]. Patient verbalized understanding

## 2019-10-25 ENCOUNTER — Ambulatory Visit: Payer: Federal, State, Local not specified - PPO | Admitting: Family Medicine

## 2019-10-31 ENCOUNTER — Telehealth: Payer: Self-pay | Admitting: Licensed Clinical Social Worker

## 2019-10-31 DIAGNOSIS — Z113 Encounter for screening for infections with a predominantly sexual mode of transmission: Secondary | ICD-10-CM | POA: Diagnosis not present

## 2019-10-31 DIAGNOSIS — Z01419 Encounter for gynecological examination (general) (routine) without abnormal findings: Secondary | ICD-10-CM | POA: Diagnosis not present

## 2019-10-31 NOTE — Telephone Encounter (Signed)
Revealed negative genetic testing.  This normal result is reassuring.  It is unlikely that there is an increased risk of cancer due to a mutation in one of these genes.  However, genetic testing is not perfect, and cannot definitively rule out a hereditary cause.  It will be important for her to keep in contact with genetics to learn if any additional testing may be needed in the future.      

## 2019-11-01 ENCOUNTER — Ambulatory Visit: Payer: Self-pay | Admitting: Licensed Clinical Social Worker

## 2019-11-01 ENCOUNTER — Encounter: Payer: Self-pay | Admitting: Licensed Clinical Social Worker

## 2019-11-01 DIAGNOSIS — Z1379 Encounter for other screening for genetic and chromosomal anomalies: Secondary | ICD-10-CM | POA: Insufficient documentation

## 2019-11-01 DIAGNOSIS — Z801 Family history of malignant neoplasm of trachea, bronchus and lung: Secondary | ICD-10-CM

## 2019-11-01 DIAGNOSIS — M545 Low back pain: Secondary | ICD-10-CM | POA: Diagnosis not present

## 2019-11-01 DIAGNOSIS — M542 Cervicalgia: Secondary | ICD-10-CM | POA: Diagnosis not present

## 2019-11-01 DIAGNOSIS — R0789 Other chest pain: Secondary | ICD-10-CM | POA: Diagnosis not present

## 2019-11-01 DIAGNOSIS — Z800 Family history of malignant neoplasm of digestive organs: Secondary | ICD-10-CM

## 2019-11-01 NOTE — Progress Notes (Signed)
HPI:  [name] was previously seen in the [place] clinic due to a family history of pancreatic cancer and concerns regarding a hereditary predisposition to cancer. Please refer to our prior cancer genetics clinic note for more information regarding our discussion, assessment and recommendations, at the time. [name] recent genetic test results were disclosed to her, as were recommendations warranted by these results. These results and recommendations are discussed in more detail below.  CANCER HISTORY:  Oncology History   No history exists.    FAMILY HISTORY:  We obtained a detailed, 4-generation family history.  Significant diagnoses are listed below: Family History  Problem Relation Age of Onset  . Kidney disease Mother   . Alcohol abuse Father   . Lung cancer Father   . Diabetes Maternal Aunt   . Diabetes Maternal Grandmother   . Cirrhosis Paternal Grandfather   . Pancreatic cancer Paternal Grandfather   . Pancreatic cancer Paternal Uncle   . Dementia Paternal Grandmother    [name] has 2 daughters and 1 son, no cancers. She has 1 sister, 62, with no history of cancer.  [name] mother is living at 37 with no history of cancer. Patient has 2 maternal aunts, 1 maternal uncle, no cancers. She has 1 maternal first cousin, no cancers. Maternal grandfather passed at 22 and grandmother passed in her 80s.  [name]'s father is living at 64 and was recently diagnosed with lung cancer. He also has had skin cancer in the past. Patient had 4 paternal uncles and 2 paternal aunts. One of her uncles had pancreatic cancer in his 69s. She does not have information about paternal cousins, limited info in general on this side. Paternal grandmother passed due to dementia in her 68s and paternal grandfather also had pancreatic cancer or possibly liver cancer, diagnosed over the age of 52.   [name] is unaware of previous family history of genetic testing for hereditary  cancer risks. Patient's ancestors are of [place] and [place] descent.There is no reported Ashkenazi Jewish ancestry. There is no known consanguinity.  GENETIC TEST RESULTS: Genetic testing reported out on 10/29/2019 through the [facility] Common Hereditary cancer panel found no pathogenic mutations.  The Common Hereditary Cancers Panel offered by [facility] includes sequencing and/or deletion duplication testing of the following 48 genes: APC, ATM, AXIN2, BARD1, BMPR1A, BRCA1, BRCA2, BRIP1, CDH1, CDKN2A (p14ARF), CDKN2A (p16INK4a), CKD4, CHEK2, CTNNA1, DICER1, EPCAM (Deletion/duplication testing only), GREM1 (promoter region deletion/duplication testing only), KIT, MEN1, MLH1, MSH2, MSH3, MSH6, MUTYH, NBN, NF1, NHTL1, PALB2, PDGFRA, PMS2, POLD1, POLE, PTEN, RAD50, RAD51C, RAD51D, RNF43, SDHB, SDHC, SDHD, SMAD4, SMARCA4. STK11, TP53, TSC1, TSC2, and VHL.  The following genes were evaluated for sequence changes only: SDHA and HOXB13 c.251G>A variant only.  The test report has been scanned into EPIC and is located under the Molecular Pathology section of the Results Review tab.  A portion of the result report is included below for reference.     We discussed with [name] that because current genetic testing is not perfect, it is possible there may be a gene mutation in one of these genes that current testing cannot detect, but that chance is small.  We also discussed, that there could be another gene that has not yet been discovered, or that we have not yet tested, that is responsible for the cancer diagnoses in the family. It is also possible there is a hereditary cause for the cancer in the family that [name] did not  inherit and therefore was not identified in her testing.  Therefore, it is important to remain in touch with cancer genetics in the future so that we can continue to offer [name] the most up to date genetic testing.    ADDITIONAL GENETIC TESTING: We discussed with [name] that her genetic  testing was fairly extensive.  If there are genes identified to increase cancer risk that can be analyzed in the future, we would be happy to discuss and coordinate this testing at that time.    CANCER SCREENING RECOMMENDATIONS: [name] test result is considered negative (normal).  This means that we have not identified a hereditary cause for her family history of cancer at this time.  While reassuring, this does not definitively rule out a hereditary predisposition to cancer. It is still possible that there could be genetic mutations that are undetectable by current technology. There could be genetic mutations in genes that have not been tested or identified to increase cancer risk.  Therefore, it is recommended she continue to follow the cancer management and screening guidelines provided by her primary healthcare provider.   An individual's cancer risk and medical management are not determined by genetic test results alone. Overall cancer risk assessment incorporates additional factors, including personal medical history, family history, and any available genetic information that may result in a personalized plan for cancer prevention and surveillance.   As for pancreatic cancer screening, she does not meet CAPS or NCCN criteria for this at this time, however her father does meet this criteria given his 2 first degree relatives with pancreatic cancer. He could consider pancreatic cancer screening.  RECOMMENDATIONS FOR FAMILY MEMBERS:  Relatives in this family might be at some increased risk of developing cancer, over the general population risk, simply due to the family history of cancer.  We recommended female relatives in this family have a yearly mammogram beginning at age 14, or 54 years younger than the earliest onset of cancer, an annual clinical breast exam, and perform monthly breast self-exams. Female relatives in this family should also have a gynecological exam as recommended by their  primary provider. All family members should have a colonoscopy by age 69, or as directed by their physicians.   It is also possible there is a hereditary cause for the cancer in [name] family that she did not inherit and therefore was not identified in her.  Based on Ms. Betzold's family history, we recommended her father/paternal relatives have genetic counseling and testing. Ms. Deblois will let us know if we can be of any assistance in coordinating genetic counseling and/or testing for these family members.  FOLLOW-UP: Lastly, we discussed with [name] that cancer genetics is a rapidly advancing field and it is possible that new genetic tests will be appropriate for her and/or her family members in the future. We encouraged her to remain in contact with cancer genetics on an annual basis so we can update her personal and family histories and let her know of advances in cancer genetics that may benefit this family.   Our contact number was provided. [name] questions were answered to her satisfaction, and she knows she is welcome to call us at anytime with additional questions or concerns.   Faith Rogue, MS, [facility] Genetic Counselor [email or web address] Phone: [phone]

## 2019-11-02 ENCOUNTER — Other Ambulatory Visit: Payer: Self-pay

## 2019-11-02 ENCOUNTER — Encounter: Payer: Self-pay | Admitting: Family Medicine

## 2019-11-02 ENCOUNTER — Telehealth (INDEPENDENT_AMBULATORY_CARE_PROVIDER_SITE_OTHER): Payer: Federal, State, Local not specified - PPO | Admitting: Family Medicine

## 2019-11-02 VITALS — Ht 60.0 in | Wt 183.0 lb

## 2019-11-02 DIAGNOSIS — I517 Cardiomegaly: Secondary | ICD-10-CM
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
## 2019-11-02 NOTE — Assessment & Plan Note (Signed)
Has been evaluated with x-rays with no acute findings.  Discussed that the arthritis and scoliosis were likely incidental findings.  Advised that it could take weeks for the muscular discomfort to improve.  She does note mild improvement over the last day or so.  She will monitor.

## 2019-11-02 NOTE — Assessment & Plan Note (Signed)
Borderline cardiomegaly noted on recent x-ray.  X-ray report from January reviewed with no indication of cardiomegaly.  Discussed given her mild dyspnea and this finding on x-ray an echo would be the next step.  This has been ordered.  Advised to seek medical attention if she develops significant shortness of breath or worsening symptoms.

## 2019-11-03 ENCOUNTER — Other Ambulatory Visit
Admission: RE | Admit: 2019-11-03 | Discharge: 2019-11-03 | Disposition: A | Discharge status: Home / Self Care | Payer: Federal, State, Local not specified - PPO | Source: Ambulatory Visit | Attending: Gastroenterology | Admitting: Gastroenterology

## 2019-11-03 DIAGNOSIS — Z01812 Encounter for preprocedural laboratory examination: Secondary | ICD-10-CM | POA: Insufficient documentation

## 2019-11-03 DIAGNOSIS — Z20822 Contact with and (suspected) exposure to covid-19: Secondary | ICD-10-CM | POA: Diagnosis not present

## 2019-11-03 LAB — SARS CORONAVIRUS 2 (TAT 6-24 HRS): SARS Coronavirus 2: NEGATIVE

## 2019-11-07 ENCOUNTER — Encounter: Payer: Self-pay | Admitting: Gastroenterology

## 2019-11-07 ENCOUNTER — Ambulatory Visit: Payer: Federal, State, Local not specified - PPO | Admitting: Registered Nurse

## 2019-11-07 ENCOUNTER — Ambulatory Visit
Admission: RE | Admit: 2019-11-07 | Discharge: 2019-11-07 | Disposition: A | Discharge status: Home / Self Care | Payer: Federal, State, Local not specified - PPO | Attending: Gastroenterology | Admitting: Gastroenterology

## 2019-11-07 ENCOUNTER — Other Ambulatory Visit: Payer: Self-pay

## 2019-11-07 ENCOUNTER — Encounter
Admission: RE | Disposition: A | Discharge status: Home / Self Care | Payer: Self-pay | Source: Home / Self Care | Attending: Gastroenterology

## 2019-11-07 DIAGNOSIS — Z7951 Long term (current) use of inhaled steroids: Secondary | ICD-10-CM | POA: Diagnosis not present

## 2019-11-07 DIAGNOSIS — K644 Residual hemorrhoidal skin tags: Secondary | ICD-10-CM | POA: Insufficient documentation

## 2019-11-07 DIAGNOSIS — Z1211 Encounter for screening for malignant neoplasm of colon: Secondary | ICD-10-CM | POA: Diagnosis not present

## 2019-11-07 DIAGNOSIS — Z882 Allergy status to sulfonamides status: Secondary | ICD-10-CM | POA: Insufficient documentation

## 2019-11-07 DIAGNOSIS — Z8379 Family history of other diseases of the digestive system: Secondary | ICD-10-CM | POA: Insufficient documentation

## 2019-11-07 DIAGNOSIS — Z91041 Radiographic dye allergy status: Secondary | ICD-10-CM | POA: Insufficient documentation

## 2019-11-07 DIAGNOSIS — K625 Hemorrhage of anus and rectum: Secondary | ICD-10-CM

## 2019-11-07 DIAGNOSIS — J45909 Unspecified asthma, uncomplicated: Secondary | ICD-10-CM | POA: Diagnosis not present

## 2019-11-07 DIAGNOSIS — R197 Diarrhea, unspecified: Secondary | ICD-10-CM | POA: Insufficient documentation

## 2019-11-07 HISTORY — PX: COLONOSCOPY WITH PROPOFOL: SHX5780

## 2019-11-07 SURGERY — COLONOSCOPY WITH PROPOFOL
Anesthesia: General

## 2019-11-07 MED ORDER — SODIUM CHLORIDE 0.9 % IV SOLN
INTRAVENOUS | Status: DC
  Administered 2019-11-07: 1000 mL via INTRAVENOUS

## 2019-11-07 MED ORDER — PROPOFOL 10 MG/ML IV BOLUS
INTRAVENOUS | Status: DC | PRN
  Administered 2019-11-07: 80 mg via INTRAVENOUS

## 2019-11-07 MED ORDER — MIDAZOLAM HCL 2 MG/2ML IJ SOLN
INTRAMUSCULAR | Status: DC | PRN
  Administered 2019-11-07: 2 mg via INTRAVENOUS

## 2019-11-07 MED ORDER — PROPOFOL 500 MG/50ML IV EMUL
INTRAVENOUS | Status: DC | PRN
  Administered 2019-11-07: 150 ug/kg/min via INTRAVENOUS

## 2019-11-07 MED ORDER — MIDAZOLAM HCL 2 MG/2ML IJ SOLN
INTRAMUSCULAR | Status: AC
  Filled 2019-11-07: qty 2

## 2019-11-07 MED ORDER — LIDOCAINE HCL (CARDIAC) PF 100 MG/5ML IV SOSY
PREFILLED_SYRINGE | INTRAVENOUS | Status: DC | PRN
  Administered 2019-11-07: 40 mg via INTRAVENOUS

## 2019-11-07 MED ORDER — PROPOFOL 500 MG/50ML IV EMUL
INTRAVENOUS | Status: AC
  Filled 2019-11-07: qty 50

## 2019-11-07 MED ORDER — SODIUM CHLORIDE 0.9 % IV SOLN: INTRAVENOUS | Status: DC

## 2019-11-07 NOTE — Transfer of Care (Signed)
Immediate Anesthesia Transfer of Care Note  Patient: Stacy Yang  Procedure(s) Performed: Procedure(s): COLONOSCOPY WITH PROPOFOL (N/A)  Patient Location: PACU and Endoscopy Unit  Anesthesia Type:General  Level of Consciousness: sedated  Airway & Oxygen Therapy: Patient Spontanous Breathing and Patient connected to nasal cannula oxygen  Post-op Assessment: Report given to RN and Post -op Vital signs reviewed and stable  Post vital signs: Reviewed and stable  Last Vitals:  Vitals:   11/07/19 0923 11/07/19 1010  BP: 100/75 (!) 93/56  Pulse: 70 72  Resp: 18 20  Temp: (!) 36 C   SpO2: 100% 97%    Complications: No apparent anesthesia complications

## 2019-11-07 NOTE — Anesthesia Postprocedure Evaluation (Signed)
Anesthesia Post Note  Patient: Stacy Yang  Procedure(s) Performed: COLONOSCOPY WITH PROPOFOL (N/A )  Patient location during evaluation: Endoscopy Anesthesia Type: General Level of consciousness: awake and alert Pain management: pain level controlled Vital Signs Assessment: post-procedure vital signs reviewed and stable Respiratory status: spontaneous breathing, nonlabored ventilation, respiratory function stable and patient connected to nasal cannula oxygen Cardiovascular status: blood pressure returned to baseline and stable Postop Assessment: no apparent nausea or vomiting Anesthetic complications: no     Last Vitals:  Vitals:   11/07/19 0923 11/07/19 1010  BP: 100/75 (!) 93/56  Pulse: 70 72  Resp: 18 20  Temp: (!) 36 C (!) 35.8 C  SpO2: 100% 97%    Last Pain:  Vitals:   11/07/19 1010  TempSrc: Temporal  PainSc: Asleep                 Corinda Gubler

## 2019-11-07 NOTE — H&P (Signed)
Arlyss Repress, MD [address]  [address]  [place], [place] [address]  Main: [phone]  Fax: [phone] Pager: [phone]  Primary Care Physician:  Glori Luis, MD Primary Gastroenterologist:  Dr. Arlyss Repress  Pre-Procedure History & Physical: HPI:  Stacy Yang is a 39 y.o. female is here for an colonoscopy.   Past Medical History:  Diagnosis Date  . Acne   . Asthma   . Family history of lung cancer   . Family history of pancreatic cancer     Past Surgical History:  Procedure Laterality Date  . CESAREAN SECTION     x3  . ESOPHAGOGASTRODUODENOSCOPY (EGD) WITH PROPOFOL N/A 07/15/2017   Procedure: ESOPHAGOGASTRODUODENOSCOPY (EGD) WITH PROPOFOL;  Surgeon: Toney Reil, MD;  Location: [facility];  Service: Endoscopy;  Laterality: N/A;  . TUBAL LIGATION      Prior to Admission medications   Medication Sig Start Date End Date Taking? Authorizing Provider  Fluticasone-Salmeterol (AIRDUO RESPICLICK 113/14) 113-14 MCG/ACT AEPB Inhale 1 puff into the lungs 2 (two) times daily. 08/25/19  Yes Coral Ceo, NP  Sodium Sulfate-Mag Sulfate-KCl (SUTAB) [phone] MG TABS Take 12 tablets by mouth in the morning and at bedtime. At 5pm take 12 tablets and then 5 hours prior to colonoscopy take the other 12. 10/20/19  Yes Vail Basista, Loel Dubonnet, MD  albuterol (PROVENTIL HFA;VENTOLIN HFA) 108 (90 Base) MCG/ACT inhaler Inhale 2 puffs into the lungs every 6 (six) hours as needed for wheezing or shortness of breath. 11/20/17   Glori Luis, MD    Allergies as of 10/20/2019 - Review Complete 10/20/2019  Allergen Reaction Noted  . Iodinated diagnostic agents Hives 12/26/2014  . Iodine Swelling 11/04/2013  . Sulfur Hives 12/26/2014    Family History  Problem Relation Age of Onset  . Kidney disease Mother   . Alcohol abuse Father   . Lung cancer Father   . Diabetes Maternal Aunt   . Diabetes Maternal Grandmother   . Cirrhosis Paternal Grandfather     . Pancreatic cancer Paternal Grandfather   . Pancreatic cancer Paternal Uncle   . Dementia Paternal Grandmother     Social History   Socioeconomic History  . Marital status: Divorced    Spouse name: Not on file  . Number of children: Not on file  . Years of education: Not on file  . Highest education level: Not on file  Occupational History  . Not on file  Tobacco Use  . Smoking status: Never Smoker  . Smokeless tobacco: Never Used  Substance and Sexual Activity  . Alcohol use: No  . Drug use: No  . Sexual activity: Yes  Other Topics Concern  . Not on file  Social History Narrative  . Not on file   Social Determinants of Health   Financial Resource Strain:   . Difficulty of Paying Living Expenses:   Food Insecurity:   . Worried About Programme researcher, broadcasting/film/video in the Last Year:   . Barista in the Last Year:   Transportation Needs:   . Freight forwarder (Medical):   Marland Kitchen Lack of Transportation (Non-Medical):   Physical Activity:   . Days of Exercise per Week:   . Minutes of Exercise per Session:   Stress:   . Feeling of Stress :   Social Connections:   . Frequency of Communication with Friends and Family:   . Frequency of Social Gatherings with Friends and Family:   . Attends  Religious Services:   . Active Member of Clubs or Organizations:   . Attends Archivist Meetings:   Marland Kitchen Marital Status:   Intimate Partner Violence:   . Fear of Current or Ex-Partner:   . Emotionally Abused:   Marland Kitchen Physically Abused:   . Sexually Abused:     Review of Systems: See HPI, otherwise negative ROS  Physical Exam: BP 100/75   Pulse 70   Temp (!) 96.8 F (36 C) (Temporal)   Resp 18   Ht 5' (1.524 m)   Wt 81.8 kg   LMP 10/15/2019   SpO2 100%   BMI 35.22 kg/m  General:   Alert,  pleasant and cooperative in NAD Head:  Normocephalic and atraumatic. Neck:  Supple; no masses or thyromegaly. Lungs:  Clear throughout to auscultation.    Heart:  Regular rate and  rhythm. Abdomen:  Soft, nontender and nondistended. Normal bowel sounds, without guarding, and without rebound.   Neurologic:  Alert and  oriented x4;  grossly normal neurologically.  Impression/Plan: Stacy Yang is here for an colonoscopy to be performed for  loose stool, lower abdominal cramps and unintentional weight loss  Risks, benefits, limitations, and alternatives regarding  colonoscopy have been reviewed with the patient.  Questions have been answered.  All parties agreeable.   Sherri Sear, MD  11/07/2019, 9:37 AM

## 2019-11-07 NOTE — Anesthesia Preprocedure Evaluation (Signed)
Anesthesia Evaluation  Patient identified by MRN, date of birth, ID band Patient awake    Reviewed: Allergy & Precautions, NPO status , Patient's Chart, lab work & pertinent test results  History of Anesthesia Complications Negative for: history of anesthetic complications  Airway Mallampati: I  TM Distance: >3 FB Neck ROM: Full    Dental no notable dental hx. (+) Teeth Intact   Pulmonary asthma , sleep apnea , neg COPD, Patient abstained from smoking.Not current smoker,  Stopped using CPAP   Pulmonary exam normal breath sounds clear to auscultation       Cardiovascular Exercise Tolerance: Good METS(-) hypertension(-) CAD and (-) Past MI negative cardio ROS  (-) dysrhythmias  Rhythm:Regular Rate:Normal - Systolic murmurs    Neuro/Psych PSYCHIATRIC DISORDERS Anxiety Depression negative neurological ROS     GI/Hepatic neg GERD  ,(+)     (-) substance abuse  ,   Endo/Other  neg diabetes  Renal/GU negative Renal ROS     Musculoskeletal   Abdominal   Peds  Hematology   Anesthesia Other Findings Past Medical History: No date: Acne No date: Asthma No date: Family history of lung cancer No date: Family history of pancreatic cancer  Reproductive/Obstetrics                             Anesthesia Physical Anesthesia Plan  ASA: II  Anesthesia Plan: General   Post-op Pain Management:    Induction: Intravenous  PONV Risk Score and Plan: 3 and Ondansetron, Propofol infusion and TIVA  Airway Management Planned: Nasal Cannula  Additional Equipment: None  Intra-op Plan:   Post-operative Plan:   Informed Consent: I have reviewed the patients History and Physical, chart, labs and discussed the procedure including the risks, benefits and alternatives for the proposed anesthesia with the patient or authorized representative who has indicated his/her understanding and acceptance.      Dental advisory given  Plan Discussed with: CRNA and Surgeon  Anesthesia Plan Comments: (Discussed risks of anesthesia with patient, including possibility of difficulty with spontaneous ventilation under anesthesia necessitating airway intervention, PONV, and rare risks such as cardiac or respiratory or neurological events. Patient understands.)        Anesthesia Quick Evaluation

## 2019-11-07 NOTE — Anesthesia Procedure Notes (Signed)
Date/Time: 11/07/2019 9:47 AM Performed by: Stormy Fabian, CRNA Pre-anesthesia Checklist: Patient identified, Emergency Drugs available, Suction available and Patient being monitored Patient Re-evaluated:Patient Re-evaluated prior to induction Oxygen Delivery Method: Nasal cannula Induction Type: IV induction Dental Injury: Teeth and Oropharynx as per pre-operative assessment  Comments: Nasal cannula with etCO2 monitoring

## 2019-11-07 NOTE — Op Note (Signed)
[facility] Gastroenterology Patient Name: Stacy Yang Procedure Date: 11/07/2019 9:18 AM MRN: [id number] Account #: [id number] Date of Birth: 1980/11/03 Admit Type: Outpatient Age: 39 Room: [facility] ENDO ROOM 1 Gender: Female Note Status: Finalized Procedure:             Colonoscopy Indications:           Clinically significant diarrhea of unexplained origin,                         Rectal bleeding Providers:             Lin Landsman MD, MD Referring MD:          Angela Adam. Caryl Bis (Referring MD) Medicines:             Monitored Anesthesia Care Complications:         No immediate complications. Estimated blood loss: None. Procedure:             Pre-Anesthesia Assessment:                        - Prior to the procedure, a History and Physical was                         performed, and patient medications and allergies were                         reviewed. The patient is competent. The risks and                         benefits of the procedure and the sedation options and                         risks were discussed with the patient. All questions                         were answered and informed consent was obtained.                         Patient identification and proposed procedure were                         verified by the physician, the nurse, the                         anesthesiologist, the anesthetist and the technician                         in the pre-procedure area in the procedure room in the                         endoscopy suite. Mental Status Examination: alert and                         oriented. Airway Examination: normal oropharyngeal                         airway and neck mobility. Respiratory Examination:  clear to auscultation. CV Examination: normal.                         Prophylactic Antibiotics: The patient does not require                         prophylactic antibiotics. Prior Anticoagulants: The                         patient has taken no previous anticoagulant or                         antiplatelet agents. ASA Grade Assessment: II - A                         patient with mild systemic disease. After reviewing                         the risks and benefits, the patient was deemed in                         satisfactory condition to undergo the procedure. The                         anesthesia plan was to use monitored anesthesia care                         (MAC). Immediately prior to administration of                         medications, the patient was re-assessed for adequacy                         to receive sedatives. The heart rate, respiratory                         rate, oxygen saturations, blood pressure, adequacy of                         pulmonary ventilation, and response to care were                         monitored throughout the procedure. The physical                         status of the patient was re-assessed after the                         procedure.                        After obtaining informed consent, the colonoscope was                         passed under direct vision. Throughout the procedure,                         the patient's blood pressure, pulse, and oxygen  saturations were monitored continuously. The                         Colonoscope was introduced through the anus and                         advanced to the the terminal ileum, with                         identification of the appendiceal orifice and IC                         valve. The colonoscopy was performed without                         difficulty. The patient tolerated the procedure well.                         The quality of the bowel preparation was evaluated                         using the BBPS [facility] Bowel Preparation Scale) with                         scores of: Right Colon = 3, Transverse Colon = 3 and                         Left Colon = 3  (entire mucosa seen well with no                         residual staining, small fragments of stool or opaque                         liquid). The total BBPS score equals 9. Findings:      The perianal and digital rectal examinations were normal. Pertinent       negatives include normal sphincter tone and no palpable rectal lesions.      The colon (entire examined portion) appeared normal.      The terminal ileum appeared normal.      Non-bleeding external hemorrhoids were found during retroflexion. The       hemorrhoids were small. Impression:            - The entire examined colon is normal.                        - The examined portion of the ileum was normal.                        - Non-bleeding external hemorrhoids.                        - No specimens collected. Recommendation:        - Discharge patient to home (with escort).                        - Resume previous diet today.                        -  Continue present medications.                        - Return to my office as previously scheduled. Procedure Code(s):     --- Professional ---                        [phone], Colonoscopy, flexible; diagnostic, including                         collection of specimen(s) by brushing or washing, when                         performed (separate procedure) Diagnosis Code(s):     --- Professional ---                        K64.4, Residual hemorrhoidal skin tags                        R19.7, Diarrhea, unspecified                        K62.5, Hemorrhage of anus and rectum CPT copyright 2019 American Medical Association. All rights reserved. The codes documented in this report are preliminary and upon coder review may  be revised to meet current compliance requirements. Dr. Ulyess Mort Lin Landsman MD, MD 11/07/2019 10:06:55 AM This report has been signed electronically. Number of Addenda: 0 Note Initiated On: 11/07/2019 9:18 AM Scope Withdrawal Time: 0 hours 5 minutes 3  seconds  Total Procedure Duration: 0 hours 9 minutes 16 seconds  Estimated Blood Loss:  Estimated blood loss: none.      [facility]

## 2019-11-08 ENCOUNTER — Encounter: Payer: Self-pay | Admitting: *Deleted

## 2019-11-30 ENCOUNTER — Other Ambulatory Visit: Payer: Self-pay

## 2019-11-30 ENCOUNTER — Encounter: Payer: Self-pay | Admitting: Family Medicine

## 2019-11-30 ENCOUNTER — Ambulatory Visit (INDEPENDENT_AMBULATORY_CARE_PROVIDER_SITE_OTHER): Payer: Federal, State, Local not specified - PPO | Admitting: Family Medicine

## 2019-11-30 VITALS — BP 125/80 | HR 94 | Temp 97.5°F | Ht 60.0 in | Wt 180.2 lb

## 2019-11-30 DIAGNOSIS — S134XXD Sprain of ligaments of cervical spine, subsequent encounter: Secondary | ICD-10-CM | POA: Diagnosis not present

## 2019-11-30 DIAGNOSIS — I517 Cardiomegaly: Secondary | ICD-10-CM

## 2019-11-30 DIAGNOSIS — R634 Abnormal weight loss: Secondary | ICD-10-CM | POA: Diagnosis not present

## 2019-11-30 DIAGNOSIS — M5412 Radiculopathy, cervical region: Secondary | ICD-10-CM | POA: Diagnosis not present

## 2019-11-30 NOTE — Assessment & Plan Note (Signed)
I cautioned her on seeing a chiropractor.  Discussed that I do not typically advise anyone to see a chiropractor given the potential risks of manipulation.  We will obtain an MRI given persistent radicular symptoms.  Refer to PT as well.
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
## 2019-11-30 NOTE — Assessment & Plan Note (Signed)
We will work on getting her echo scheduled.

## 2019-11-30 NOTE — Patient Instructions (Signed)
Nice to see you. We will get you set up for an MRI and PT.

## 2019-11-30 NOTE — Assessment & Plan Note (Signed)
Stabilized.  Suspect related to diet and exercise.

## 2019-12-14 ENCOUNTER — Other Ambulatory Visit: Payer: Self-pay

## 2019-12-14 ENCOUNTER — Ambulatory Visit
Admission: RE | Admit: 2019-12-14 | Discharge: 2019-12-14 | Disposition: A | Discharge status: Home / Self Care | Payer: Federal, State, Local not specified - PPO | Source: Ambulatory Visit | Attending: Family Medicine | Admitting: Family Medicine

## 2019-12-14 DIAGNOSIS — M5412 Radiculopathy, cervical region: Secondary | ICD-10-CM | POA: Insufficient documentation

## 2019-12-14 DIAGNOSIS — M542 Cervicalgia: Secondary | ICD-10-CM | POA: Diagnosis not present

## 2019-12-16 ENCOUNTER — Telehealth: Payer: Self-pay

## 2019-12-16 NOTE — Telephone Encounter (Signed)
Pt called returning your call 

## 2019-12-16 NOTE — Telephone Encounter (Signed)
-----   Message from Glori Luis, MD sent at 12/15/2019  9:46 AM EDT ----- Please let the patient know that her MRI revealed multiple degenerative disc issues.  1 of these discs does contact her spinal cord.  Given her left arm numbness and this finding I would like for her to see a neurosurgeon to discuss potential treatment options.  I can place this referral once you speak with her.  If she ever develops persistent numbness or weakness she needs to go to the emergency room.

## 2019-12-16 NOTE — Telephone Encounter (Signed)
I called and spoke with the patient and informed her of her MRI results.  Stacy Yang,cma

## 2019-12-16 NOTE — Telephone Encounter (Signed)
Pt called again to reach you

## 2019-12-19 ENCOUNTER — Other Ambulatory Visit: Payer: Self-pay | Admitting: Family Medicine

## 2019-12-19 DIAGNOSIS — M5412 Radiculopathy, cervical region: Secondary | ICD-10-CM

## 2019-12-19 DIAGNOSIS — M4802 Spinal stenosis, cervical region: Secondary | ICD-10-CM

## 2019-12-19 DIAGNOSIS — M5030 Other cervical disc degeneration, unspecified cervical region: Secondary | ICD-10-CM

## 2019-12-21 ENCOUNTER — Ambulatory Visit
Admission: RE | Admit: 2019-12-21 | Discharge: 2019-12-21 | Disposition: A | Discharge status: Home / Self Care | Payer: Federal, State, Local not specified - PPO | Source: Ambulatory Visit | Attending: Family Medicine | Admitting: Family Medicine

## 2019-12-21 ENCOUNTER — Other Ambulatory Visit: Payer: Self-pay

## 2019-12-21 DIAGNOSIS — I517 Cardiomegaly: Secondary | ICD-10-CM

## 2019-12-21 NOTE — Progress Notes (Signed)
*  PRELIMINARY RESULTS* Echocardiogram 2D Echocardiogram has been performed.  Cristela Blue 12/21/2019, 10:43 AM

## 2019-12-27 ENCOUNTER — Ambulatory Visit: Payer: Federal, State, Local not specified - PPO | Attending: Family Medicine

## 2019-12-27 ENCOUNTER — Other Ambulatory Visit: Payer: Self-pay

## 2019-12-27 DIAGNOSIS — M5412 Radiculopathy, cervical region: Secondary | ICD-10-CM | POA: Diagnosis not present

## 2019-12-27 DIAGNOSIS — M546 Pain in thoracic spine: Secondary | ICD-10-CM | POA: Diagnosis not present

## 2019-12-27 DIAGNOSIS — M542 Cervicalgia: Secondary | ICD-10-CM | POA: Diagnosis not present

## 2019-12-27 DIAGNOSIS — R200 Anesthesia of skin: Secondary | ICD-10-CM | POA: Diagnosis not present
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
## 2020-01-02 ENCOUNTER — Ambulatory Visit: Payer: Federal, State, Local not specified - PPO | Admitting: Gastroenterology

## 2020-01-02 ENCOUNTER — Encounter: Payer: Self-pay | Admitting: *Deleted

## 2020-01-03 ENCOUNTER — Other Ambulatory Visit: Payer: Self-pay

## 2020-01-03 ENCOUNTER — Ambulatory Visit: Payer: Federal, State, Local not specified - PPO

## 2020-01-03 DIAGNOSIS — M5412 Radiculopathy, cervical region: Secondary | ICD-10-CM | POA: Diagnosis not present

## 2020-01-03 DIAGNOSIS — M546 Pain in thoracic spine: Secondary | ICD-10-CM

## 2020-01-03 DIAGNOSIS — M542 Cervicalgia: Secondary | ICD-10-CM | POA: Diagnosis not present
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
## 2020-01-04 ENCOUNTER — Ambulatory Visit: Payer: Federal, State, Local not specified - PPO

## 2020-01-04 DIAGNOSIS — M5412 Radiculopathy, cervical region: Secondary | ICD-10-CM

## 2020-01-04 DIAGNOSIS — M542 Cervicalgia: Secondary | ICD-10-CM

## 2020-01-04 DIAGNOSIS — M546 Pain in thoracic spine: Secondary | ICD-10-CM
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
## 2020-01-10 ENCOUNTER — Other Ambulatory Visit: Payer: Self-pay

## 2020-01-10 ENCOUNTER — Ambulatory Visit: Payer: Federal, State, Local not specified - PPO

## 2020-01-10 DIAGNOSIS — M5412 Radiculopathy, cervical region: Secondary | ICD-10-CM

## 2020-01-10 DIAGNOSIS — M542 Cervicalgia: Secondary | ICD-10-CM

## 2020-01-10 DIAGNOSIS — M546 Pain in thoracic spine: Secondary | ICD-10-CM

## 2020-01-10 DIAGNOSIS — G4733 Obstructive sleep apnea (adult) (pediatric): Secondary | ICD-10-CM | POA: Diagnosis not present
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
## 2020-01-11 ENCOUNTER — Ambulatory Visit: Payer: Federal, State, Local not specified - PPO

## 2020-01-11 DIAGNOSIS — M5412 Radiculopathy, cervical region: Secondary | ICD-10-CM | POA: Diagnosis not present

## 2020-01-11 DIAGNOSIS — M546 Pain in thoracic spine: Secondary | ICD-10-CM | POA: Diagnosis not present

## 2020-01-11 DIAGNOSIS — M542 Cervicalgia: Secondary | ICD-10-CM | POA: Diagnosis not present
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
## 2020-01-17 ENCOUNTER — Ambulatory Visit: Payer: Federal, State, Local not specified - PPO

## 2020-01-19 ENCOUNTER — Ambulatory Visit: Payer: Federal, State, Local not specified - PPO | Attending: Family Medicine

## 2020-01-19 ENCOUNTER — Other Ambulatory Visit: Payer: Self-pay

## 2020-01-19 DIAGNOSIS — M546 Pain in thoracic spine: Secondary | ICD-10-CM | POA: Insufficient documentation

## 2020-01-19 DIAGNOSIS — M542 Cervicalgia: Secondary | ICD-10-CM | POA: Insufficient documentation

## 2020-01-19 DIAGNOSIS — M5412 Radiculopathy, cervical region: Secondary | ICD-10-CM | POA: Diagnosis not present
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
## 2020-01-24 ENCOUNTER — Ambulatory Visit: Payer: Federal, State, Local not specified - PPO

## 2020-01-26 ENCOUNTER — Ambulatory Visit: Payer: Federal, State, Local not specified - PPO

## 2020-01-31 ENCOUNTER — Ambulatory Visit: Payer: Federal, State, Local not specified - PPO

## 2020-02-02 ENCOUNTER — Other Ambulatory Visit: Payer: Self-pay

## 2020-02-02 ENCOUNTER — Ambulatory Visit: Payer: Federal, State, Local not specified - PPO

## 2020-02-02 DIAGNOSIS — M546 Pain in thoracic spine: Secondary | ICD-10-CM

## 2020-02-02 DIAGNOSIS — M5412 Radiculopathy, cervical region: Secondary | ICD-10-CM

## 2020-02-02 DIAGNOSIS — M542 Cervicalgia: Secondary | ICD-10-CM | POA: Diagnosis not present
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
## 2020-02-07 ENCOUNTER — Ambulatory Visit: Payer: Federal, State, Local not specified - PPO

## 2020-02-09 ENCOUNTER — Other Ambulatory Visit: Payer: Self-pay

## 2020-02-09 ENCOUNTER — Ambulatory Visit: Payer: Federal, State, Local not specified - PPO

## 2020-02-09 DIAGNOSIS — M542 Cervicalgia: Secondary | ICD-10-CM

## 2020-02-09 DIAGNOSIS — M546 Pain in thoracic spine: Secondary | ICD-10-CM

## 2020-02-09 DIAGNOSIS — M5412 Radiculopathy, cervical region: Secondary | ICD-10-CM | POA: Diagnosis not present
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
## 2020-02-14 ENCOUNTER — Ambulatory Visit: Payer: Federal, State, Local not specified - PPO

## 2020-02-15 ENCOUNTER — Ambulatory Visit: Payer: Federal, State, Local not specified - PPO

## 2020-02-16 ENCOUNTER — Ambulatory Visit: Payer: Federal, State, Local not specified - PPO | Attending: Family Medicine

## 2020-02-16 ENCOUNTER — Other Ambulatory Visit: Payer: Self-pay

## 2020-02-16 DIAGNOSIS — M546 Pain in thoracic spine: Secondary | ICD-10-CM | POA: Diagnosis not present

## 2020-02-16 DIAGNOSIS — M542 Cervicalgia: Secondary | ICD-10-CM

## 2020-02-16 DIAGNOSIS — M5412 Radiculopathy, cervical region: Secondary | ICD-10-CM | POA: Diagnosis not present
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
## 2020-02-16 NOTE — Addendum Note (Signed)
Addended by: Bethanie Dicker on: 02/16/2020 02:28 PM   Modules accepted: Orders

## 2020-02-21 ENCOUNTER — Other Ambulatory Visit: Payer: Self-pay

## 2020-02-21 ENCOUNTER — Ambulatory Visit: Payer: Federal, State, Local not specified - PPO

## 2020-02-21 DIAGNOSIS — M542 Cervicalgia: Secondary | ICD-10-CM

## 2020-02-21 DIAGNOSIS — M546 Pain in thoracic spine: Secondary | ICD-10-CM

## 2020-02-21 DIAGNOSIS — M5412 Radiculopathy, cervical region: Secondary | ICD-10-CM | POA: Diagnosis not present
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
## 2020-02-23 ENCOUNTER — Ambulatory Visit: Payer: Federal, State, Local not specified - PPO

## 2020-02-27 ENCOUNTER — Other Ambulatory Visit: Payer: Self-pay

## 2020-02-27 ENCOUNTER — Ambulatory Visit: Payer: Federal, State, Local not specified - PPO

## 2020-03-05 ENCOUNTER — Other Ambulatory Visit: Payer: Self-pay

## 2020-03-05 ENCOUNTER — Ambulatory Visit (INDEPENDENT_AMBULATORY_CARE_PROVIDER_SITE_OTHER): Payer: Federal, State, Local not specified - PPO | Admitting: Family Medicine

## 2020-03-05 ENCOUNTER — Encounter: Payer: Self-pay | Admitting: Family Medicine

## 2020-03-05 ENCOUNTER — Ambulatory Visit (INDEPENDENT_AMBULATORY_CARE_PROVIDER_SITE_OTHER): Payer: Federal, State, Local not specified - PPO

## 2020-03-05 VITALS — BP 120/80 | HR 73 | Temp 98.2°F | Ht 60.0 in | Wt 184.2 lb

## 2020-03-05 DIAGNOSIS — E669 Obesity, unspecified: Secondary | ICD-10-CM

## 2020-03-05 DIAGNOSIS — M79641 Pain in right hand: Secondary | ICD-10-CM

## 2020-03-05 DIAGNOSIS — M5412 Radiculopathy, cervical region: Secondary | ICD-10-CM

## 2020-03-05 NOTE — Patient Instructions (Signed)
Nice to see you. We will get an x-ray today and contact you with the results. Please ice the area as we discussed.  If not improving over the next 2 weeks please let us know.

## 2020-03-05 NOTE — Assessment & Plan Note (Signed)
Encouraged getting back to exercising.  Encouraged healthy diet.

## 2020-03-05 NOTE — Assessment & Plan Note (Signed)
Could represent de Quervain's tenosynovitis versus soft tissue injury versus arthritis.  Discussed icing of the area for 10 minutes 3 times daily.  X-ray today.  If not improving over the next couple of weeks she will let us know.

## 2020-03-05 NOTE — Assessment & Plan Note (Signed)
Symptoms have improved significantly.  She will finish physical therapy and monitor for any recurrence.

## 2020-03-05 NOTE — Progress Notes (Signed)
[name], MD Phone: [phone]  Stacy Yang is a 39 y.o. female who presents today for follow-up.  Neck and back pain: Patient notes these things have resolved.  She saw neurosurgery who offered her an injection through physiatry though she declined that.  She has been doing physical therapy and going to the chiropractor.  She notes no neck pain or back pain.  She notes the numbness she was having previously has resolved.  Right hand pain: Patient notes her right hand has been bothering her at the base of her right thumb for some time.  She feels as though there is decreased strength in that hand compared to the left.  She notes no numbness.  She notes in the past on Finkelstein's testing she would have discomfort in that area.  She is returning to work tomorrow and her boss is going to find light work for her to do.  Obesity: She has not been able to exercise much with the issues with her pain.  She is going to start getting back into the gym to do the treadmill.  She has been eating fairly healthfully.  She avoids soft drinks.  She does eat ice cream occasionally.  Social History   Tobacco Use  Smoking Status Never Smoker  Smokeless Tobacco Never Used     ROS see history of present illness  Objective  Physical Exam Vitals:   03/05/20 0904  BP: 120/80  Pulse: 73  Temp: 98.2 F (36.8 C)  SpO2: 99%    BP Readings from Last 3 Encounters:  03/05/20 120/80  11/30/19 125/80  11/07/19 113/76   Wt Readings from Last 3 Encounters:  03/05/20 184 lb 3.2 oz (83.6 kg)  11/30/19 180 lb 3.2 oz (81.7 kg)  11/07/19 180 lb 5.7 oz (81.8 kg)    Physical Exam Constitutional:      General: She is not in acute distress.    Appearance: She is not diaphoretic.  Cardiovascular:     Rate and Rhythm: Normal rate and regular rhythm.     Heart sounds: Normal heart sounds.  Pulmonary:     Effort: Pulmonary effort is normal.     Breath sounds: Normal breath sounds.    Musculoskeletal:     Comments: Right hand with no tenderness over the thumb or wrist, no discomfort on Finkelstein's testing, left hand with no tenderness over the thumb or wrist, no discomfort on Finkelstein's testing  Skin:    General: Skin is warm and dry.  Neurological:     Mental Status: She is alert.      Assessment/Plan: Please see individual problem list.  Right hand pain Could represent de Quervain's tenosynovitis versus soft tissue injury versus arthritis.  Discussed icing of the area for 10 minutes 3 times daily.  X-ray today.  If not improving over the next couple of weeks she will let us know.  Obesity (BMI 35.0-39.9 without comorbidity) Encouraged getting back to exercising.  Encouraged healthy diet.  Cervical radiculopathy Symptoms have improved significantly.  She will finish physical therapy and monitor for any recurrence.    Orders Placed This Encounter  Procedures  . DG Hand Complete Right    Standing Status:   Future    Number of Occurrences:   1    Standing Expiration Date:   03/05/2021    Order Specific Question:   Reason for Exam (SYMPTOM  OR DIAGNOSIS REQUIRED)    Answer:   right hand pain near the base of her thumb  Order Specific Question:   Is patient pregnant?    Answer:   No    Order Specific Question:   Preferred imaging location?    Answer:   [employer] Specific Question:   Radiology Contrast Protocol - do NOT remove file path    Answer:   [email or web address]    No orders of the defined types were placed in this encounter.   This visit occurred during the SARS-CoV-2 public health emergency.  Safety protocols were in place, including screening questions prior to the visit, additional usage of staff PPE, and extensive cleaning of exam room while observing appropriate contact time as indicated for disinfecting solutions.    [name], MD [facility]

## 2020-03-08 ENCOUNTER — Ambulatory Visit: Payer: Federal, State, Local not specified - PPO

## 2020-03-08 ENCOUNTER — Other Ambulatory Visit: Payer: Self-pay

## 2020-03-08 DIAGNOSIS — M542 Cervicalgia: Secondary | ICD-10-CM | POA: Diagnosis not present

## 2020-03-08 DIAGNOSIS — M546 Pain in thoracic spine: Secondary | ICD-10-CM

## 2020-03-08 DIAGNOSIS — M5412 Radiculopathy, cervical region: Secondary | ICD-10-CM
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
## 2020-03-13 ENCOUNTER — Other Ambulatory Visit: Payer: Self-pay

## 2020-03-13 ENCOUNTER — Ambulatory Visit: Payer: Federal, State, Local not specified - PPO

## 2020-03-13 DIAGNOSIS — M542 Cervicalgia: Secondary | ICD-10-CM

## 2020-03-13 DIAGNOSIS — M5412 Radiculopathy, cervical region: Secondary | ICD-10-CM | POA: Diagnosis not present

## 2020-03-13 DIAGNOSIS — M546 Pain in thoracic spine: Secondary | ICD-10-CM
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
## 2020-04-04 DIAGNOSIS — M79644 Pain in right finger(s): Secondary | ICD-10-CM | POA: Diagnosis not present

## 2020-04-04 DIAGNOSIS — M79645 Pain in left finger(s): Secondary | ICD-10-CM | POA: Diagnosis not present

## 2020-04-09 DIAGNOSIS — G4733 Obstructive sleep apnea (adult) (pediatric): Secondary | ICD-10-CM | POA: Diagnosis not present

## 2020-04-16 DIAGNOSIS — M222X2 Patellofemoral disorders, left knee: Secondary | ICD-10-CM | POA: Diagnosis not present

## 2020-04-16 DIAGNOSIS — M222X1 Patellofemoral disorders, right knee: Secondary | ICD-10-CM | POA: Diagnosis not present

## 2020-04-16 DIAGNOSIS — M25561 Pain in right knee: Secondary | ICD-10-CM | POA: Diagnosis not present

## 2020-04-16 DIAGNOSIS — M25562 Pain in left knee: Secondary | ICD-10-CM | POA: Diagnosis not present

## 2020-04-24 DIAGNOSIS — Z113 Encounter for screening for infections with a predominantly sexual mode of transmission: Secondary | ICD-10-CM | POA: Diagnosis not present

## 2020-04-24 DIAGNOSIS — N939 Abnormal uterine and vaginal bleeding, unspecified: Secondary | ICD-10-CM | POA: Diagnosis not present

## 2020-05-01 ENCOUNTER — Telehealth: Payer: Self-pay | Admitting: Family Medicine

## 2020-05-01 NOTE — Telephone Encounter (Signed)
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 No appts avail for PCP for a couple of weeks. Pt needs this done asap. Please advise.   I can print immunization records but need advice on urine. Luiz Trumpower,cma

## 2020-05-01 NOTE — Telephone Encounter (Signed)
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 No appts avail for PCP for a couple of weeks. Pt needs this done asap. Please advise

## 2020-05-01 NOTE — Telephone Encounter (Signed)
What type of urine test does she need?

## 2020-05-02 NOTE — Telephone Encounter (Signed)
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

## 2020-05-07 ENCOUNTER — Other Ambulatory Visit: Payer: Self-pay

## 2020-05-09 ENCOUNTER — Ambulatory Visit (INDEPENDENT_AMBULATORY_CARE_PROVIDER_SITE_OTHER): Payer: Federal, State, Local not specified - PPO | Admitting: Nurse Practitioner

## 2020-05-09 ENCOUNTER — Encounter: Payer: Self-pay | Admitting: Nurse Practitioner

## 2020-05-09 ENCOUNTER — Other Ambulatory Visit: Payer: Self-pay

## 2020-05-09 VITALS — BP 108/78 | HR 81 | Temp 98.8°F | Ht 60.0 in | Wt 185.0 lb

## 2020-05-09 DIAGNOSIS — Z23 Encounter for immunization: Secondary | ICD-10-CM | POA: Diagnosis not present

## 2020-05-09 DIAGNOSIS — Z020 Encounter for examination for admission to educational institution: Secondary | ICD-10-CM | POA: Diagnosis not present

## 2020-05-09 LAB — URINALYSIS, ROUTINE W REFLEX MICROSCOPIC
Bilirubin Urine: NEGATIVE
Hgb urine dipstick: NEGATIVE
Ketones, ur: NEGATIVE
Leukocytes,Ua: NEGATIVE
Nitrite: NEGATIVE
RBC / HPF: NONE SEEN (ref 0–?)
Specific Gravity, Urine: 1.025 (ref 1.000–1.030)
Total Protein, Urine: NEGATIVE
Urine Glucose: NEGATIVE
Urobilinogen, UA: 0.2 (ref 0.0–1.0)
pH: 6 (ref 5.0–8.0)

## 2020-05-09 LAB — CBC
HCT: 41.7 % (ref 36.0–46.0)
Hemoglobin: 14 g/dL (ref 12.0–15.0)
MCHC: 33.7 g/dL (ref 30.0–36.0)
MCV: 89.5 fl (ref 78.0–100.0)
Platelets: 227 10*3/uL (ref 150.0–400.0)
RBC: 4.66 Mil/uL (ref 3.87–5.11)
RDW: 13.2 % (ref 11.5–15.5)
WBC: 9.6 10*3/uL (ref 4.0–10.5)

## 2020-05-09 NOTE — Patient Instructions (Addendum)
Please go to the lab today to complete the requirements for your school physical exam.   Weight loss recommended.   Take your usual medications as directed.

## 2020-05-09 NOTE — Progress Notes (Signed)
Established Patient Office Visit  Subjective:  Patient ID: Stacy Yang, female    DOB: [birth year]  Age: 39 y.o. MRN: [id number]  CC:  Chief Complaint  Patient presents with  . 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 PMH is signif for asthma.   Asthma: A little wheezing today-  using the daily steroid inhaler and has not used the albuterol.   Past Medical History:  Diagnosis Date  . Acne   . Asthma   . Family history of lung cancer   . Family history of pancreatic cancer     Past Surgical History:  Procedure Laterality Date  . CESAREAN SECTION     x3  . COLONOSCOPY WITH PROPOFOL N/A 11/07/2019   Procedure: COLONOSCOPY WITH PROPOFOL;  Surgeon: Toney Reil, MD;  Location: [facility] ENDOSCOPY;  Service: Gastroenterology;  Laterality: N/A;  . ESOPHAGOGASTRODUODENOSCOPY (EGD) WITH PROPOFOL N/A 07/15/2017   Procedure: ESOPHAGOGASTRODUODENOSCOPY (EGD) WITH PROPOFOL;  Surgeon: Toney Reil, MD;  Location: [facility];  Service: Endoscopy;  Laterality: N/A;  . TUBAL LIGATION      Family History  Problem Relation Age of Onset  . Kidney disease Mother   . Alcohol abuse Father   . Lung cancer Father   . Diabetes Maternal Aunt   . Diabetes Maternal Grandmother   . Cirrhosis Paternal Grandfather   . Pancreatic cancer Paternal Grandfather   . Pancreatic cancer Paternal Uncle   . Dementia Paternal Grandmother     Social History   Socioeconomic History  . Marital status: Divorced    Spouse name: Not on file  . Number of children: Not on file  . Years of education: Not on file  . Highest education level: Not on file  Occupational History  . Not on file  Tobacco Use  . Smoking status: Never Smoker  . Smokeless tobacco: Never Used  Vaping Use  . Vaping Use: Never used  Substance and Sexual Activity  . Alcohol use: No  . Drug use: No  . Sexual  activity: Yes  Other Topics Concern  . Not on file  Social History Narrative  . Not on file   Social Determinants of Health   Financial Resource Strain:   . Difficulty of Paying Living Expenses: Not on file  Food Insecurity:   . Worried About Programme researcher, broadcasting/film/video in the Last Year: Not on file  . Ran Out of Food in the Last Year: Not on file  Transportation Needs:   . Lack of Transportation (Medical): Not on file  . Lack of Transportation (Non-Medical): Not on file  Physical Activity:   . Days of Exercise per Week: Not on file  . Minutes of Exercise per Session: Not on file  Stress:   . Feeling of Stress : Not on file  Social Connections:   . Frequency of Communication with Friends and Family: Not on file  . Frequency of Social Gatherings with Friends and Family: Not on file  . Attends Religious Services: Not on file  . Active Member of Clubs or Organizations: Not on file  . Attends Banker Meetings: Not on file  . Marital Status: Not on file  Intimate Partner Violence:   . Fear of Current or Ex-Partner: Not on file  . Emotionally Abused: Not on file  . Physically Abused: Not on file  .  Sexually Abused: Not on file    Outpatient Medications Prior to Visit  Medication Sig Dispense Refill  . albuterol (PROVENTIL HFA;VENTOLIN HFA) 108 (90 Base) MCG/ACT inhaler Inhale 2 puffs into the lungs every 6 (six) hours as needed for wheezing or shortness of breath. 2 Inhaler 2  . Fluticasone-Salmeterol (AIRDUO RESPICLICK 113/14) 113-14 MCG/ACT AEPB Inhale 1 puff into the lungs 2 (two) times daily. 1 each 10  . meloxicam (MOBIC) 15 MG tablet Take by mouth.     No facility-administered medications prior to visit.    Allergies  Allergen Reactions  . Iodinated Diagnostic Agents Hives  . Iodine Swelling  . Sulfur Hives    Review of Systems  Constitutional: Negative.   HENT: Negative.   Eyes: Negative.   Respiratory:       Asthma hx and not active now.     Cardiovascular: Negative.   Gastrointestinal: Negative.   Endocrine: Negative.   Genitourinary: Negative.   Musculoskeletal: Negative.   Allergic/Immunologic: Negative.   Hematological: Negative.   Psychiatric/Behavioral: Negative.       Objective:    Physical Exam Vitals reviewed.  Constitutional:      Appearance: She is obese.  Eyes:     Conjunctiva/sclera: Conjunctivae normal.     Pupils: Pupils are equal, round, and reactive to light.  Cardiovascular:     Rate and Rhythm: Normal rate and regular rhythm.     Pulses: Normal pulses.     Heart sounds: Normal heart sounds.  Pulmonary:     Effort: Pulmonary effort is normal.     Breath sounds: Normal breath sounds.  Abdominal:     Palpations: Abdomen is soft.     Tenderness: There is no abdominal tenderness.  Musculoskeletal:        General: Normal range of motion.     Cervical back: Normal range of motion and neck supple.  Skin:    General: Skin is warm and dry.  Neurological:     General: No focal deficit present.     Mental Status: She is alert and oriented to person, place, and time.  Psychiatric:        Mood and Affect: Mood normal.        Behavior: Behavior normal.     BP 108/78 (BP Location: Left Arm, Patient Position: Sitting, Cuff Size: Normal)   Pulse 81   Temp 98.8 F (37.1 C) (Oral)   Ht 5' (1.524 m)   Wt 185 lb (83.9 kg)   SpO2 99%   BMI 36.13 kg/m  Wt Readings from Last 3 Encounters:  05/09/20 185 lb (83.9 kg)  03/05/20 184 lb 3.2 oz (83.6 kg)  11/30/19 180 lb 3.2 oz (81.7 kg)   Pulse Readings from Last 3 Encounters:  05/09/20 81  03/05/20 73  11/30/19 94    BP Readings from Last 3 Encounters:  05/09/20 108/78  03/05/20 120/80  11/30/19 125/80    No results found for: CHOL, HDL, LDLCALC, LDLDIRECT, TRIG, CHOLHDL    Health Maintenance Due  Topic Date Due  . Hepatitis C Screening  Never done  . COVID-19 Vaccine (1) Never done  . HIV Screening  Never done  . TETANUS/TDAP   05/19/2017    There are no preventive care reminders to display for this patient.  Lab Results  Component Value Date   TSH 1.53 10/10/2019   Lab Results  Component Value Date   WBC 9.6 05/09/2020   HGB 14.0 05/09/2020   HCT 41.7 05/09/2020  MCV 89.5 05/09/2020   PLT 227.0 05/09/2020   Lab Results  Component Value Date   NA 136 08/25/2019   K 4.6 08/25/2019   CO2 26 08/25/2019   GLUCOSE 100 (H) 08/25/2019   BUN 14 08/25/2019   CREATININE 0.73 08/25/2019   BILITOT 0.7 10/10/2019   ALKPHOS 65 10/10/2019   AST 14 10/10/2019   ALT 13 10/10/2019   PROT 7.0 10/10/2019   ALBUMIN 4.2 10/10/2019   CALCIUM 9.0 08/25/2019   ANIONGAP 9 08/25/2019   GFR 82.02 02/21/2019   No results found for: CHOL No results found for: HDL No results found for: LDLCALC No results found for: TRIG No results found for: CHOLHDL Lab Results  Component Value Date   HGBA1C 5.5 10/10/2019      Assessment & Plan:   Problem List Items Addressed This Visit      Other   Encounter for school history and physical examination - Primary   Relevant Orders   CBC (Completed)   Urinalysis, Routine w reflex microscopic (Completed)   Need for immunization against influenza   Relevant Orders   Flu Vaccine QUAD 36+ mos IM (Completed)      No orders of the defined types were placed in this encounter. Please go to the lab today to complete the requirements for your school physical exam.   Weight loss recommended.   Take your usual medications as directed.   Follow-up: No follow-ups on file.   This visit occurred during the SARS-CoV-2 public health emergency.  Safety protocols were in place, including screening questions prior to the visit, additional usage of staff PPE, and extensive cleaning of exam room while observing appropriate contact time as indicated for disinfecting solutions.   Amedeo Kinsman, NP

## 2020-05-11 DIAGNOSIS — Z23 Encounter for immunization: Secondary | ICD-10-CM | POA: Insufficient documentation

## 2020-05-15 ENCOUNTER — Telehealth: Payer: Self-pay | Admitting: Nurse Practitioner

## 2020-05-15 DIAGNOSIS — Z020 Encounter for examination for admission to educational institution: Secondary | ICD-10-CM

## 2020-05-15 NOTE — Telephone Encounter (Signed)
I ordered the varicella titer.

## 2020-05-15 NOTE — Telephone Encounter (Signed)
Pt returned call please call back

## 2020-05-15 NOTE — Telephone Encounter (Signed)
Spoke with patient and she is not having any UTI sx; she is scheduled to come in on Monday for Quant gold lab. Looks like she is also going to need varicella titer done as well; there is only record of her having one vaccine. Patient is trying to get her complete vaccine record; [facility] only shows partial records.

## 2020-05-15 NOTE — Telephone Encounter (Signed)
I called her for school physical form and LMOM for her to call us back.   1. She needs Quant gold done- her reading from 2015 is not current. Or, she have the PPD done.   2. Her UA had some bacteria - likely contaminant but micro normal. If she has any dysuria- UA w micro  needs repeated.

## 2020-05-21 ENCOUNTER — Telehealth: Payer: Self-pay | Admitting: Family Medicine

## 2020-05-21 ENCOUNTER — Other Ambulatory Visit (INDEPENDENT_AMBULATORY_CARE_PROVIDER_SITE_OTHER): Payer: Federal, State, Local not specified - PPO

## 2020-05-21 ENCOUNTER — Other Ambulatory Visit: Payer: Self-pay

## 2020-05-21 DIAGNOSIS — Z020 Encounter for examination for admission to educational institution: Secondary | ICD-10-CM | POA: Diagnosis not present

## 2020-05-21 NOTE — Telephone Encounter (Signed)
I filled in the vaccine for the patient on her form and can you verify that the patient may have a menveo  (meningococcal) and a tdap vaccine once you let me know if she can have theses I will call nad schedule a nurse visit with her.  Camerin Jimenez,cma

## 2020-05-21 NOTE — Telephone Encounter (Signed)
Patient dropped off vaccine records for Stacy Yang to complete nursing forms. Paper is up front in color folder.

## 2020-05-22 LAB — VARICELLA ZOSTER ANTIBODY, IGG: Varicella IgG: 513.8 index

## 2020-05-24 NOTE — Telephone Encounter (Signed)
Do you have these forms? What are they for? Typically adults do not need the menveo vaccine, though depending on what this is for she may meet criteria for it.

## 2020-05-29 NOTE — Telephone Encounter (Signed)
Spoke with Dr. De Nurse this morning about patients forms and if she needed the [facility] vaccine. He stated that she did not need this; only needs Tdap done and also quant gold drawn. Patient is scheduled for nurse visit and lab visit on 05/31/20 and is aware of this. Will finish form once these two are done.

## 2020-05-29 NOTE — Addendum Note (Signed)
Addended by: Wendie Simmer B on: 05/29/2020 08:59 AM   Modules accepted: Orders

## 2020-05-31 ENCOUNTER — Other Ambulatory Visit (INDEPENDENT_AMBULATORY_CARE_PROVIDER_SITE_OTHER): Payer: Federal, State, Local not specified - PPO

## 2020-05-31 ENCOUNTER — Ambulatory Visit (INDEPENDENT_AMBULATORY_CARE_PROVIDER_SITE_OTHER): Payer: Federal, State, Local not specified - PPO

## 2020-05-31 ENCOUNTER — Other Ambulatory Visit: Payer: Self-pay

## 2020-05-31 DIAGNOSIS — Z020 Encounter for examination for admission to educational institution: Secondary | ICD-10-CM | POA: Diagnosis not present

## 2020-05-31 DIAGNOSIS — Z23 Encounter for immunization: Secondary | ICD-10-CM | POA: Diagnosis not present

## 2020-05-31 NOTE — Progress Notes (Signed)
Patient presented for TDAP injection to right deltoid, patient voiced no concerns nor showed any signs of distress during injection.   Patient wants to be able to pick up her school  Paperwork on Monday 18OCT2021

## 2020-06-02 LAB — QUANTIFERON-TB GOLD PLUS
Mitogen-NIL: 10 IU/mL
NIL: 0.07 IU/mL
QuantiFERON-TB Gold Plus: NEGATIVE
TB1-NIL: 0.01 IU/mL
TB2-NIL: <0 IU/mL

## 2020-06-14 DIAGNOSIS — L739 Follicular disorder, unspecified: Secondary | ICD-10-CM | POA: Diagnosis not present

## 2020-07-24 ENCOUNTER — Encounter: Payer: Self-pay | Admitting: Internal Medicine

## 2020-07-24 ENCOUNTER — Other Ambulatory Visit: Payer: Self-pay

## 2020-07-24 ENCOUNTER — Ambulatory Visit (INDEPENDENT_AMBULATORY_CARE_PROVIDER_SITE_OTHER): Payer: Federal, State, Local not specified - PPO | Admitting: Internal Medicine

## 2020-07-24 VITALS — BP 124/84 | HR 74 | Temp 98.3°F | Ht 60.0 in | Wt 187.2 lb

## 2020-07-24 DIAGNOSIS — R002 Palpitations: Secondary | ICD-10-CM | POA: Diagnosis not present

## 2020-07-24 DIAGNOSIS — F410 Panic disorder [episodic paroxysmal anxiety] without agoraphobia: Secondary | ICD-10-CM

## 2020-07-24 DIAGNOSIS — F419 Anxiety disorder, unspecified: Secondary | ICD-10-CM

## 2020-07-24 DIAGNOSIS — R0789 Other chest pain: Secondary | ICD-10-CM

## 2020-07-24 DIAGNOSIS — F339 Major depressive disorder, recurrent, unspecified: Secondary | ICD-10-CM

## 2020-07-24 DIAGNOSIS — R232 Flushing: Secondary | ICD-10-CM

## 2020-07-24 DIAGNOSIS — F515 Nightmare disorder: Secondary | ICD-10-CM

## 2020-07-24 DIAGNOSIS — F4310 Post-traumatic stress disorder, unspecified: Secondary | ICD-10-CM | POA: Insufficient documentation

## 2020-07-24 DIAGNOSIS — R079 Chest pain, unspecified: Secondary | ICD-10-CM | POA: Insufficient documentation

## 2020-07-24 DIAGNOSIS — F4321 Adjustment disorder with depressed mood: Secondary | ICD-10-CM

## 2020-07-24 DIAGNOSIS — Z87898 Personal history of other specified conditions: Secondary | ICD-10-CM

## 2020-07-24 DIAGNOSIS — R110 Nausea: Secondary | ICD-10-CM

## 2020-07-24 DIAGNOSIS — G4700 Insomnia, unspecified: Secondary | ICD-10-CM

## 2020-07-24 DIAGNOSIS — R42 Dizziness and giddiness: Secondary | ICD-10-CM

## 2020-07-24 DIAGNOSIS — R0602 Shortness of breath: Secondary | ICD-10-CM

## 2020-07-24 DIAGNOSIS — F32A Depression, unspecified: Secondary | ICD-10-CM

## 2020-07-24 MED ORDER — LORAZEPAM 0.5 MG PO TABS: 0.2500 mg | ORAL_TABLET | Freq: Every evening | ORAL | 0 refills | Status: DC | PRN

## 2020-07-24 MED ORDER — CITALOPRAM HYDROBROMIDE 10 MG PO TABS: 10.0000 mg | ORAL_TABLET | Freq: Every day | ORAL | 0 refills | Status: DC

## 2020-07-24 NOTE — Patient Instructions (Addendum)
Tylenol and ibuprofen as needed for headache prefer tylenol with small amounts caffeine  Dr. Maryruth Bun psychiatry [facility] cardiology  Ok to take melatonin   Citalopram tablets What is this medicine? CITALOPRAM (sye TAL oh pram) is a medicine for depression. This medicine may be used for other purposes; ask your health care provider or pharmacist if you have questions. COMMON BRAND NAME(S): Celexa What should I tell my health care provider before I take this medicine? They need to know if you have any of these conditions:  bleeding disorders  bipolar disorder or a family history of bipolar disorder  glaucoma  heart disease  history of irregular heartbeat  kidney disease  liver disease  low levels of magnesium or potassium in the blood  receiving electroconvulsive therapy  seizures  suicidal thoughts, plans, or attempt; a previous suicide attempt by you or a family member  take medicines that treat or prevent blood clots  thyroid disease  an unusual or allergic reaction to citalopram, escitalopram, other medicines, foods, dyes, or preservatives  pregnant or trying to become pregnant  breast-feeding How should I use this medicine? Take this medicine by mouth with a glass of water. Follow the directions on the prescription label. You can take it with or without food. Take your medicine at regular intervals. Do not take your medicine more often than directed. Do not stop taking this medicine suddenly except upon the advice of your doctor. Stopping this medicine too quickly may cause serious side effects or your condition may worsen. A special MedGuide will be given to you by the pharmacist with each prescription and refill. Be sure to read this information carefully each time. Talk to your pediatrician regarding the use of this medicine in children. Special care may be needed. Patients over 46 years old may have a stronger reaction and need a smaller dose.  Overdosage: If you think you have taken too much of this medicine contact a poison control center or emergency room at once. NOTE: This medicine is only for you. Do not share this medicine with others. What if I miss a dose? If you miss a dose, take it as soon as you can. If it is almost time for your next dose, take only that dose. Do not take double or extra doses. What may interact with this medicine? Do not take this medicine with any of the following medications:  certain medicines for fungal infections like fluconazole, itraconazole, ketoconazole, posaconazole, voriconazole  cisapride  dronedarone  escitalopram  linezolid  MAOIs like Carbex, Eldepryl, Marplan, Nardil, and Parnate  methylene blue (injected into a vein)  pimozide  thioridazine This medicine may also interact with the following medications:  alcohol  amphetamines  aspirin and aspirin-like medicines  carbamazepine  certain medicines for depression, anxiety, or psychotic disturbances  certain medicines for infections like chloroquine, clarithromycin, erythromycin, furazolidone, isoniazid, pentamidine  certain medicines for migraine headaches like almotriptan, eletriptan, frovatriptan, naratriptan, rizatriptan, sumatriptan, zolmitriptan  certain medicines for sleep  certain medicines that treat or prevent blood clots like dalteparin, enoxaparin, warfarin  cimetidine  diuretics  dofetilide  fentanyl  lithium  methadone  metoprolol  NSAIDs, medicines for pain and inflammation, like ibuprofen or naproxen  omeprazole  other medicines that prolong the QT interval (cause an abnormal heart rhythm)  procarbazine  rasagiline  supplements like St. John's wort, kava kava, valerian  tramadol  tryptophan  ziprasidone This list may not describe all possible interactions. Give your health care provider a list of  all the medicines, herbs, non-prescription drugs, or dietary supplements you  use. Also tell them if you smoke, drink alcohol, or use illegal drugs. Some items may interact with your medicine. What should I watch for while using this medicine? Tell your doctor if your symptoms do not get better or if they get worse. Visit your doctor or health care professional for regular checks on your progress. Because it may take several weeks to see the full effects of this medicine, it is important to continue your treatment as prescribed by your doctor. Patients and their families should watch out for new or worsening thoughts of suicide or depression. Also watch out for sudden changes in feelings such as feeling anxious, agitated, panicky, irritable, hostile, aggressive, impulsive, severely restless, overly excited and hyperactive, or not being able to sleep. If this happens, especially at the beginning of treatment or after a change in dose, call your health care professional. Bonita QuinYou may get drowsy or dizzy. Do not drive, use machinery, or do anything that needs mental alertness until you know how this medicine affects you. Do not stand or sit up quickly, especially if you are an older patient. This reduces the risk of dizzy or fainting spells. Alcohol may interfere with the effect of this medicine. Avoid alcoholic drinks. Your mouth may get dry. Chewing sugarless gum or sucking hard candy, and drinking plenty of water will help. Contact your doctor if the problem does not go away or is severe. What side effects may I notice from receiving this medicine? Side effects that you should report to your doctor or health care professional as soon as possible:  allergic reactions like skin rash, itching or hives, swelling of the face, lips, or tongue  anxious  black, tarry stools  breathing problems  changes in vision  chest pain  confusion  elevated mood, decreased need for sleep, racing thoughts, impulsive behavior  eye pain  fast, irregular heartbeat  feeling faint or  lightheaded, falls  feeling agitated, angry, or irritable  hallucination, loss of contact with reality  loss of balance or coordination  loss of memory  painful or prolonged erections  restlessness, pacing, inability to keep still  seizures  stiff muscles  suicidal thoughts or other mood changes  trouble sleeping  unusual bleeding or bruising  unusually weak or tired  vomiting Side effects that usually do not require medical attention (report to your doctor or health care professional if they continue or are bothersome):  change in appetite or weight  change in sex drive or performance  dizziness  headache  increased sweating  indigestion, nausea  tremors This list may not describe all possible side effects. Call your doctor for medical advice about side effects. You may report side effects to FDA at 1-800-FDA-1088. Where should I keep my medicine? Keep out of reach of children. Store at room temperature between 15 and 30 degrees C (59 and 86 degrees F). Throw away any unused medicine after the expiration date. NOTE: This sheet is a summary. It may not cover all possible information. If you have questions about this medicine, talk to your doctor, pharmacist, or health care provider.  2020 Elsevier/Gold Standard (2018-07-26 09:05:36)  Lorazepam tablets What is this medicine? LORAZEPAM (lor A ze pam) is a benzodiazepine. It is used to treat anxiety. This medicine may be used for other purposes; ask your health care provider or pharmacist if you have questions. COMMON BRAND NAME(S): Ativan What should I tell my health care provider before  I take this medicine? They need to know if you have any of these conditions:  glaucoma  history of drug or alcohol abuse problem  kidney disease  liver disease  lung or breathing disease, like asthma  mental illness  myasthenia gravis  Parkinson's disease  suicidal thoughts, plans, or attempt; a previous suicide  attempt by you or a family member  an unusual or allergic reaction to lorazepam, other medicines, foods, dyes, or preservatives  pregnant or trying to get pregnant  breast-feeding How should I use this medicine? Take this medicine by mouth with a glass of water. Follow the directions on the prescription label. Take your medicine at regular intervals. Do not take it more often than directed. Do not stop taking except on your doctor's advice. A special MedGuide will be given to you by the pharmacist with each prescription and refill. Be sure to read this information carefully each time. Talk to your pediatrician regarding the use of this medicine in children. While this drug may be used in children as young as 12 years for selected conditions, precautions do apply. Overdosage: If you think you have taken too much of this medicine contact a poison control center or emergency room at once. NOTE: This medicine is only for you. Do not share this medicine with others. What if I miss a dose? If you miss a dose, take it as soon as you can. If it is almost time for your next dose, take only that dose. Do not take double or extra doses. What may interact with this medicine? Do not take this medicine with any of the following medications:  narcotic medicines for cough  sodium oxybate This medicine may also interact with the following medications:  alcohol  antihistamines for allergy, cough and cold  certain medicines for anxiety or sleep  certain medicines for depression, like amitriptyline, fluoxetine, sertraline  certain medicines for seizures like carbamazepine, phenobarbital, phenytoin, primidone  general anesthetics like lidocaine, pramoxine, tetracaine  MAOIs like Carbex, Eldepryl, Marplan, Nardil, and Parnate  medicines that relax muscles for surgery  narcotic medicines for pain  phenothiazines like chlorpromazine, mesoridazine, prochlorperazine, thioridazine This list may not  describe all possible interactions. Give your health care provider a list of all the medicines, herbs, non-prescription drugs, or dietary supplements you use. Also tell them if you smoke, drink alcohol, or use illegal drugs. Some items may interact with your medicine. What should I watch for while using this medicine? Tell your doctor or health care professional if your symptoms do not start to get better or if they get worse. Do not stop taking except on your doctor's advice. You may develop a severe reaction. Your doctor will tell you how much medicine to take. You may get drowsy or dizzy. Do not drive, use machinery, or do anything that needs mental alertness until you know how this medicine affects you. To reduce the risk of dizzy and fainting spells, do not stand or sit up quickly, especially if you are an older patient. Alcohol may increase dizziness and drowsiness. Avoid alcoholic drinks. If you are taking another medicine that also causes drowsiness, you may have more side effects. Give your health care provider a list of all medicines you use. Your doctor will tell you how much medicine to take. Do not take more medicine than directed. Call emergency for help if you have problems breathing or unusual sleepiness. What side effects may I notice from receiving this medicine? Side effects that you should report  to your doctor or health care professional as soon as possible:  allergic reactions like skin rash, itching or hives, swelling of the face, lips, or tongue  breathing problems  confusion  loss of balance or coordination  signs and symptoms of low blood pressure like dizziness; feeling faint or lightheaded, falls; unusually weak or tired  suicidal thoughts or other mood changes Side effects that usually do not require medical attention (report to your doctor or health care professional if they continue or are bothersome):  dizziness  headache  nausea, vomiting  tiredness This  list may not describe all possible side effects. Call your doctor for medical advice about side effects. You may report side effects to FDA at 1-800-FDA-1088. Where should I keep my medicine? Keep out of the reach of children. This medicine can be abused. Keep your medicine in a safe place to protect it from theft. Do not share this medicine with anyone. Selling or giving away this medicine is dangerous and against the law. This medicine may cause accidental overdose and death if taken by other adults, children, or pets. Mix any unused medicine with a substance like cat litter or coffee grounds. Then throw the medicine away in a sealed container like a sealed bag or a coffee can with a lid. Do not use the medicine after the expiration date. Store at room temperature between 20 and 25 degrees C (68 and 77 degrees F). Protect from light. Keep container tightly closed. NOTE: This sheet is a summary. It may not cover all possible information. If you have questions about this medicine, talk to your doctor, pharmacist, or health care provider.  2020 Elsevier/Gold Standard (2015-05-03 15:54:27)  Fluoxetine capsules or tablets (PMDD indication) What is this medicine? FLUOXETINE (floo OX e teen) belongs to a class of drugs known as selective serotonin reuptake inhibitors (SSRIs). It is used for premenstrual dysphoric disorder (PMDD). PMDD causes intense mood and physical symptoms a week or two before your period every month. This drug helps improve mood swings, tiredness, tension, and breast tenderness. This medicine may be used for other purposes; ask your health care provider or pharmacist if you have questions. COMMON BRAND NAME(S): Prozac, Sarafem, Selfemra What should I tell my health care provider before I take this medicine? They need to know if you have any of these conditions:  bipolar disorder or a family history of bipolar disorder  bleeding disorders  glaucoma  heart disease  liver  disease  low levels of sodium in the blood  seizures  suicidal thoughts, plans, or attempt; a previous suicide attempt by you or a family member  take MAOIs like Carbex, Eldepryl, Marplan, Nardil, and Parnate  take medicines that treat or prevent blood clots  thyroid disease  an unusual or allergic reaction to fluoxetine, other medicines, foods, dyes, or preservatives  pregnant or trying to get pregnant  breast-feeding  bipolar disorder or a family history of bipolar disorder  bleeding disorders  glaucoma  heart disease  liver disease  low levels of sodium in the blood  seizures  suicidal thoughts, plans, or attempt; a previous suicide attempt by you or a family member  take MAOIs like Carbex, Eldepryl, Marplan, Nardil, and Parnate  take medicines that treat or prevent blood clots  thyroid disease  an unusual or allergic reaction to fluoxetine, other medicines, foods, dyes, or preservatives  pregnant or trying to get pregnant  breast-feeding How should I use this medicine? Take this medicine by mouth with a glass  of water. Follow the directions on the prescription label. You can take it with or without food. Take your medicine at regular intervals. Do not take it more often than directed. Do not stop taking this medicine suddenly except upon the advice of your doctor. Stopping this medicine too quickly may cause serious side effects or your condition may worsen. A special MedGuide will be given to you by the pharmacist with each prescription and refill. Be sure to read this information carefully each time. Talk to your pediatrician regarding the use of this medicine in children. Special care may be needed. Overdosage: If you think you have taken too much of this medicine contact a poison control center or emergency room at once. NOTE: This medicine is only for you. Do not share this medicine with others. What if I miss a dose? If you miss a dose, skip the missed  dose and go back to your regular dosing schedule. Do not take double or extra doses. What may interact with this medicine? Do not take this medicine with any of the following medications:  other medicines containing fluoxetine, like Prozac or Symbyax  cisapride  dronedarone  linezolid  MAOIs like Carbex, Eldepryl, Marplan, Nardil, and Parnate  methylene blue (injected into a vein)  pimozide  thioridazine This medicine may also interact with the following medications:  alcohol  amphetamines  aspirin and aspirin-like medicines  carbamazepine  certain medicines for depression, anxiety, or psychotic disturbances  certain medicines for migraine headaches like almotriptan, eletriptan, frovatriptan, naratriptan, rizatriptan, sumatriptan, zolmitriptan  digoxin  diuretics  fentanyl  flecainide  furazolidone  isoniazid  lithium  medicines for sleep  medicines that treat or prevent blood clots like warfarin, enoxaparin, and dalteparin  NSAIDs, medicines for pain and inflammation, like ibuprofen or naproxen  other medicines that prolong the QT interval (an abnormal heart rhythm)  phenytoin  procarbazine  propafenone  rasagiline  ritonavir  supplements like St. John's wort, kava kava, valerian  tramadol  tryptophan  vinblastine This list may not describe all possible interactions. Give your health care provider a list of all the medicines, herbs, non-prescription drugs, or dietary supplements you use. Also tell them if you smoke, drink alcohol, or use illegal drugs. Some items may interact with your medicine. What should I watch for while using this medicine? Tell your doctor if your symptoms do not get better or if they get worse. Visit your doctor or health care professional for regular checks on your progress. Patients and their families should watch out for new or worsening thoughts of suicide or depression. Also watch out for sudden changes in  feelings such as feeling anxious, agitated, panicky, irritable, hostile, aggressive, impulsive, severely restless, overly excited and hyperactive, or not being able to sleep. If this happens, especially at the beginning of treatment or after a change in dose, call your health care professional. Bonita Quin may get drowsy or dizzy. Do not drive, use machinery, or do anything that needs mental alertness until you know how this medicine affects you. Do not stand or sit up quickly, especially if you are an older patient. This reduces the risk of dizzy or fainting spells. Alcohol may interfere with the effect of this medicine. Avoid alcoholic drinks. Your mouth may get dry. Chewing sugarless gum or sucking hard candy, and drinking plenty of water may help. Contact your doctor if the problem does not go away or is severe. This medicine may affect blood sugar levels. If you have diabetes, check with your  doctor or health care professional before you change your diet or the dose of your diabetic medicine. What side effects may I notice from receiving this medicine? Side effects that you should report to your doctor or health care professional as soon as possible:  allergic reactions like skin rash, itching or hives, swelling of the face, lips, or tongue  anxious  black, tarry stools  breathing problems  changes in vision  confusion  elevated mood, decreased need for sleep, racing thoughts, impulsive behavior  eye pain  fast, irregular heartbeat  feeling faint or lightheaded, falls  feeling agitated, angry, or irritable  hallucination, loss of contact with reality  loss of balance or coordination  loss of memory  restlessness, pacing, inability to keep still  seizures  stiff muscles  suicidal thoughts or other mood changes  trouble sleeping  unusual bleeding or bruising  unusually weak or tired  vomiting Side effects that usually do not require medical attention (report to your  doctor or health care professional if they continue or are bothersome):  change in appetite or weight  change in sex drive or performance  diarrhea  dry mouth  headache  increased sweating  indigestion, nausea  tremors This list may not describe all possible side effects. Call your doctor for medical advice about side effects. You may report side effects to FDA at 1-800-FDA-1088. Where should I keep my medicine? Keep out of the reach of children. Store at room temperature between 15 and 30 degrees C (59 and 86 degrees F). Throw away any unused medicine after the expiration date. NOTE: This sheet is a summary. It may not cover all possible information. If you have questions about this medicine, talk to your doctor, pharmacist, or health care provider.  2020 Elsevier/Gold Standard (2018-03-25 12:45:43)  Panic Attack A panic attack is a sudden episode of severe anxiety, fear, or discomfort that causes physical and emotional symptoms. The attack may be in response to something frightening, or it may occur for no known reason. Symptoms of a panic attack can be similar to symptoms of a heart attack or stroke. It is important to see your health care provider when you have a panic attack so that these conditions can be ruled out. A panic attack is a symptom of another condition. Most panic attacks go away with treatment of the underlying problem. If you have panic attacks often, you may have a condition called panic disorder. What are the causes? A panic attack may be caused by:  An extreme, life-threatening situation, such as a war or natural disaster.  An anxiety disorder, such as post-traumatic stress disorder.  Depression.  Certain medical conditions, including heart problems, neurological conditions, and infections.  Certain over-the-counter and prescription medicines.  Illegal drugs that increase heart rate and blood pressure, such as methamphetamine.  Alcohol.   Supplements that increase anxiety.  Panic disorder. What increases the risk? You are more likely to develop this condition if:  You have an anxiety disorder.  You have another mental health condition.  You take certain medicines.  You use alcohol, illegal drugs, or other substances.  You are under extreme stress.  A life event is causing increased feelings of anxiety and depression. What are the signs or symptoms? A panic attack starts suddenly, usually lasts about 20 minutes, and occurs with one or more of the following:  A pounding heart.  A feeling that your heart is beating irregularly or faster than normal (palpitations).  Sweating.  Trembling or  shaking.  Shortness of breath or feeling smothered.  Feeling choked.  Chest pain or discomfort.  Nausea or a strange feeling in your stomach.  Dizziness, feeling lightheaded, or feeling like you might faint.  Chills or hot flashes.  Numbness or tingling in your lips, hands, or feet.  Feeling confused, or feeling that you are not yourself.  Fear of losing control or being emotionally unstable.  Fear of dying. How is this diagnosed? A panic attack is diagnosed with an assessment by your health care provider. During the assessment your health care provider will ask questions about:  Your history of anxiety, depression, and panic attacks.  Your medical history.  Whether you drink alcohol, use illegal drugs, take supplements, or take medicines. Be honest about your substance use. Your health care provider may also:  Order blood tests or other kinds of tests to rule out serious medical conditions.  Refer you to a mental health professional for further evaluation. How is this treated? Treatment depends on the cause of the panic attack:  If the cause is a medical problem, your health care provider will either treat that problem or refer you to a specialist.  If the cause is emotional, you may be given  anti-anxiety medicines or referred to a counselor. These medicines may reduce how often attacks happen, reduce how severe the attacks are, and lower anxiety.  If the cause is a medicine, your health care provider may tell you to stop the medicine, change your dose, or take a different medicine.  If the cause is a drug, treatment may involve letting the drug wear off and taking medicine to help the drug leave your body or to counteract its effects. Attacks caused by drug abuse may continue even if you stop using the drug. Follow these instructions at home:  Take over-the-counter and prescription medicines only as told by your health care provider.  If you feel anxious, limit your caffeine intake.  Take good care of your physical and mental health by: ? Eating a balanced diet that includes plenty of fresh fruits and vegetables, whole grains, lean meats, and low-fat dairy. ? Getting plenty of rest. Try to get 7-8 hours of uninterrupted sleep each night. ? Exercising regularly. Try to get 30 minutes of physical activity at least 5 days a week. ? Not smoking. Talk to your health care provider if you need help quitting. ? Limiting alcohol intake to no more than 1 drink a day for nonpregnant women and 2 drinks a day for men. One drink equals 12 oz of beer, 5 oz of wine, or 1 oz of hard liquor.  Keep all follow-up visits as told by your health care provider. This is important. Panic attacks may have underlying physical or emotional problems that take time to accurately diagnose. Contact a health care provider if:  Your symptoms do not improve, or they get worse.  You are not able to take your medicine as prescribed because of side effects. Get help right away if:  You have serious thoughts about hurting yourself or others.  You have symptoms of a panic attack. Do not drive yourself to the hospital. Have someone else drive you or call an ambulance. If you ever feel like you may hurt yourself or  others, or you have thoughts about taking your own life, get help right away. You can go to your nearest emergency department or call:  Your local emergency services (911 in the [place]  A suicide crisis helpline, such  as the National Suicide Prevention Lifeline at 336 067 6756. This is open 24 hours a day. Summary  A panic attack is a sign of a serious health or mental health condition. Get help right away. Do not drive yourself to the hospital. Have someone else drive you or call an ambulance.  Always see a health care provider to have the reasons for the panic attack correctly diagnosed.  If your panic attack was caused by a physical problem, follow your health care provider's suggestions for medicine, referral to a specialist, and lifestyle changes.  If your panic attack was caused by an emotional problem, follow through with counseling from a qualified mental health specialist.  If you feel like you may hurt yourself or others, call 911 and get help right away. This information is not intended to replace advice given to you by your health care provider. Make sure you discuss any questions you have with your health care provider. Document Revised: 07/17/2017 Document Reviewed: 09/12/2016 Elsevier Patient Education  2020 Elsevier Inc.   Major Depressive Disorder, Adult Major depressive disorder (MDD) is a mental health condition. MDD often makes you feel sad, hopeless, or helpless. MDD can also cause symptoms in your body. MDD can affect your:  Work.  School.  Relationships.  Other normal activities. MDD can range from mild to very bad. It may occur once (single episode MDD). It can also occur many times (recurrent MDD). The main symptoms of MDD often include:  Feeling sad, depressed, or irritable most of the time.  Loss of interest. MDD symptoms also include:  Sleeping too much or too little.  Eating too much or too little.  A change in your weight.  Feeling tired  (fatigue) or having low energy.  Feeling worthless.  Feeling guilty.  Trouble making decisions.  Trouble thinking clearly.  Thoughts of suicide or harming others.  Feeling weak.  Feeling agitated.  Keeping yourself from being around other people (isolation). Follow these instructions at home: Activity  Do these things as told by your doctor: ? Go back to your normal activities. ? Exercise regularly. ? Spend time outdoors. Alcohol  Talk with your doctor about how alcohol can affect your antidepressant medicines.  Do not drink alcohol. Or, limit how much alcohol you drink. ? This means no more than 1 drink a day for nonpregnant women and 2 drinks a day for men. One drink equals one of these:  12 oz of beer.  5 oz of wine.  1 oz of hard liquor. General instructions  Take over-the-counter and prescription medicines only as told by your doctor.  Eat a healthy diet.  Get plenty of sleep.  Find activities that you enjoy. Make time to do them.  Think about joining a support group. Your doctor may be able to suggest a group for you.  Keep all follow-up visits as told by your doctor. This is important. Where to find more information:  The [employer] on Mental Illness: ? www.nami.org  [place] [employer] of Mental Health: ? http://www.maynard.net/  National Suicide Prevention Lifeline: ? 516-459-0695. This is free, 24-hour help. Contact a doctor if:  Your symptoms get worse.  You have new symptoms. Get help right away if:  You self-harm.  You see, hear, taste, smell, or feel things that are not present (hallucinate). If you ever feel like you may hurt yourself or others, or have thoughts about taking your own life, get help right away. You can go to your nearest emergency department or call:  Your local emergency services (911 in the [place]  A suicide crisis helpline, such as the National Suicide Prevention Lifeline: ? 4323100023. This is open 24  hours a day. This information is not intended to replace advice given to you by your health care provider. Make sure you discuss any questions you have with your health care provider. Document Revised: 07/17/2017 Document Reviewed: 04/20/2016 Elsevier Patient Education  2020 Elsevier Inc.  Managing Anxiety, Adult After being diagnosed with an anxiety disorder, you may be relieved to know why you have felt or behaved a certain way. You may also feel overwhelmed about the treatment ahead and what it will mean for your life. With care and support, you can manage this condition and recover from it. How to manage lifestyle changes Managing stress and anxiety  Stress is your body's reaction to life changes and events, both good and bad. Most stress will last just a few hours, but stress can be ongoing and can lead to more than just stress. Although stress can play a major role in anxiety, it is not the same as anxiety. Stress is usually caused by something external, such as a deadline, test, or competition. Stress normally passes after the triggering event has ended.  Anxiety is caused by something internal, such as imagining a terrible outcome or worrying that something will go wrong that will devastate you. Anxiety often does not go away even after the triggering event is over, and it can become long-term (chronic) worry. It is important to understand the differences between stress and anxiety and to manage your stress effectively so that it does not lead to an anxious response. Talk with your health care provider or a counselor to learn more about reducing anxiety and stress. He or she may suggest tension reduction techniques, such as:  Music therapy. This can include creating or listening to music that you enjoy and that inspires you.  Mindfulness-based meditation. This involves being aware of your normal breaths while not trying to control your breathing. It can be done while sitting or walking.   Centering prayer. This involves focusing on a word, phrase, or sacred image that means something to you and brings you peace.  Deep breathing. To do this, expand your stomach and inhale slowly through your nose. Hold your breath for 3-5 seconds. Then exhale slowly, letting your stomach muscles relax.  Self-talk. This involves identifying thought patterns that lead to anxiety reactions and changing those patterns.  Muscle relaxation. This involves tensing muscles and then relaxing them. Choose a tension reduction technique that suits your lifestyle and personality. These techniques take time and practice. Set aside 5-15 minutes a day to do them. Therapists can offer counseling and training in these techniques. The training to help with anxiety may be covered by some insurance plans. Other things you can do to manage stress and anxiety include:  Keeping a stress/anxiety diary. This can help you learn what triggers your reaction and then learn ways to manage your response.  Thinking about how you react to certain situations. You may not be able to control everything, but you can control your response.  Making time for activities that help you relax and not feeling guilty about spending your time in this way.  Visual imagery and yoga can help you stay calm and relax.  Medicines Medicines can help ease symptoms. Medicines for anxiety include:  Anti-anxiety drugs.  Antidepressants. Medicines are often used as a primary treatment for anxiety disorder. Medicines will be prescribed  by a health care provider. When used together, medicines, psychotherapy, and tension reduction techniques may be the most effective treatment. Relationships Relationships can play a big part in helping you recover. Try to spend more time connecting with trusted friends and family members. Consider going to couples counseling, taking family education classes, or going to family therapy. Therapy can help you and others  better understand your condition. How to recognize changes in your anxiety Everyone responds differently to treatment for anxiety. Recovery from anxiety happens when symptoms decrease and stop interfering with your daily activities at home or work. This may mean that you will start to:  Have better concentration and focus. Worry will interfere less in your daily thinking.  Sleep better.  Be less irritable.  Have more energy.  Have improved memory. It is important to recognize when your condition is getting worse. Contact your health care provider if your symptoms interfere with home or work and you feel like your condition is not improving. Follow these instructions at home: Activity  Exercise. Most adults should do the following: ? Exercise for at least 150 minutes each week. The exercise should increase your heart rate and make you sweat (moderate-intensity exercise). ? Strengthening exercises at least twice a week.  Get the right amount and quality of sleep. Most adults need 7-9 hours of sleep each night. Lifestyle   Eat a healthy diet that includes plenty of vegetables, fruits, whole grains, low-fat dairy products, and lean protein. Do not eat a lot of foods that are high in solid fats, added sugars, or salt.  Make choices that simplify your life.  Do not use any products that contain nicotine or tobacco, such as cigarettes, e-cigarettes, and chewing tobacco. If you need help quitting, ask your health care provider.  Avoid caffeine, alcohol, and certain over-the-counter cold medicines. These may make you feel worse. Ask your pharmacist which medicines to avoid. General instructions  Take over-the-counter and prescription medicines only as told by your health care provider.  Keep all follow-up visits as told by your health care provider. This is important. Where to find support You can get help and support from these sources:  Self-help groups.  Online and [employer]  A trusted spiritual leader.  Couples counseling.  Family education classes.  Family therapy. Where to find more information You may find that joining a support group helps you deal with your anxiety. The following sources can help you locate counselors or support groups near you:  Mental Health [place]: www.mentalhealthamerica.net  [employer]xiety [employer] Association of [place] (ADAA): ProgramCam.de  [employer] on Mental Illness (NAMI): www.nami.org Contact a health care provider if you:  Have a hard time staying focused or finishing daily tasks.  Spend many hours a day feeling worried about everyday life.  Become exhausted by worry.  Start to have headaches, feel tense, or have nausea.  Urinate more than normal.  Have diarrhea. Get help right away if you have:  A racing heart and shortness of breath.  Thoughts of hurting yourself or others. If you ever feel like you may hurt yourself or others, or have thoughts about taking your own life, get help right away. You can go to your nearest emergency department or call:  Your local emergency services (911 in the [place]  A suicide crisis helpline, such as the National Suicide Prevention Lifeline at 2044258967. This is open 24 hours a day. Summary  Taking steps to learn and use tension reduction techniques can help calm  you and help prevent triggering an anxiety reaction.  When used together, medicines, psychotherapy, and tension reduction techniques may be the most effective treatment.  Family, friends, and partners can play a big part in helping you recover from an anxiety disorder. This information is not intended to replace advice given to you by your health care provider. Make sure you discuss any questions you have with your health care provider. Document Revised: 01/04/2019 Document Reviewed: 01/04/2019 Elsevier Patient Education  2020 Elsevier Inc.  General Headache Without Cause A  headache is pain or discomfort felt around the head or neck area. The specific cause of a headache may not be found. There are many causes and types of headaches. A few common ones are:  Tension headaches.  Migraine headaches.  Cluster headaches.  Chronic daily headaches. Follow these instructions at home: Watch your condition for any changes. Let your health care provider know about them. Take these steps to help with your condition: Managing pain      Take over-the-counter and prescription medicines only as told by your health care provider.  Lie down in a dark, quiet room when you have a headache.  If directed, put ice on your head and neck area: ? Put ice in a plastic bag. ? Place a towel between your skin and the bag. ? Leave the ice on for 20 minutes, 2-3 times per day.  If directed, apply heat to the affected area. Use the heat source that your health care provider recommends, such as a moist heat pack or a heating pad. ? Place a towel between your skin and the heat source. ? Leave the heat on for 20-30 minutes. ? Remove the heat if your skin turns bright red. This is especially important if you are unable to feel pain, heat, or cold. You may have a greater risk of getting burned.  Keep lights dim if bright lights bother you or make your headaches worse. Eating and drinking  Eat meals on a regular schedule.  If you drink alcohol: ? Limit how much you use to:  0-1 drink a day for women.  0-2 drinks a day for men. ? Be aware of how much alcohol is in your drink. In the [place] one drink equals one 12 oz bottle of beer (355 mL), one 5 oz glass of wine (148 mL), or one 1 oz glass of hard liquor (44 mL).  Stop drinking caffeine, or decrease the amount of caffeine you drink. General instructions   Keep a headache journal to help find out what may trigger your headaches. For example, write down: ? What you eat and drink. ? How much sleep you get. ? Any change to your  diet or medicines.  Try massage or other relaxation techniques.  Limit stress.  Sit up straight, and do not tense your muscles.  Do not use any products that contain nicotine or tobacco, such as cigarettes, e-cigarettes, and chewing tobacco. If you need help quitting, ask your health care provider.  Exercise regularly as told by your health care provider.  Sleep on a regular schedule. Get 7-9 hours of sleep each night, or the amount recommended by your health care provider.  Keep all follow-up visits as told by your health care provider. This is important. Contact a health care provider if:  Your symptoms are not helped by medicine.  You have a headache that is different from the usual headache.  You have nausea or you vomit.  You have a fever.  Get help right away if:  Your headache becomes severe quickly.  Your headache gets worse after moderate to intense physical activity.  You have repeated vomiting.  You have a stiff neck.  You have a loss of vision.  You have problems with speech.  You have pain in the eye or ear.  You have muscular weakness or loss of muscle control.  You lose your balance or have trouble walking.  You feel faint or pass out.  You have confusion.  You have a seizure. Summary  A headache is pain or discomfort felt around the head or neck area.  There are many causes and types of headaches. In some cases, the cause may not be found.  Keep a headache journal to help find out what may trigger your headaches. Watch your condition for any changes. Let your health care provider know about them.  Contact a health care provider if you have a headache that is different from the usual headache, or if your symptoms are not helped by medicine.  Get help right away if your headache becomes severe, you vomit, you have a loss of vision, you lose your balance, or you have a seizure. This information is not intended to replace advice given to you by  your health care provider. Make sure you discuss any questions you have with your health care provider. Document Revised: 02/22/2018 Document Reviewed: 02/22/2018 Elsevier Patient Education  2020 [employer].
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
## 2020-07-26 ENCOUNTER — Ambulatory Visit: Payer: Federal, State, Local not specified - PPO | Admitting: Cardiology

## 2020-07-26 DIAGNOSIS — G4733 Obstructive sleep apnea (adult) (pediatric): Secondary | ICD-10-CM | POA: Diagnosis not present

## 2020-07-30 ENCOUNTER — Ambulatory Visit: Payer: Federal, State, Local not specified - PPO | Admitting: Cardiology

## 2020-07-31 ENCOUNTER — Encounter: Payer: Self-pay | Admitting: Internal Medicine

## 2020-07-31 ENCOUNTER — Telehealth: Payer: Self-pay | Admitting: Family Medicine

## 2020-07-31 DIAGNOSIS — G4700 Insomnia, unspecified: Secondary | ICD-10-CM | POA: Insufficient documentation

## 2020-07-31 DIAGNOSIS — R002 Palpitations: Secondary | ICD-10-CM | POA: Insufficient documentation

## 2020-07-31 DIAGNOSIS — F515 Nightmare disorder: Secondary | ICD-10-CM | POA: Insufficient documentation

## 2020-07-31 DIAGNOSIS — Z87898 Personal history of other specified conditions: Secondary | ICD-10-CM | POA: Insufficient documentation

## 2020-07-31 DIAGNOSIS — F410 Panic disorder [episodic paroxysmal anxiety] without agoraphobia: Secondary | ICD-10-CM | POA: Insufficient documentation

## 2020-07-31 NOTE — Telephone Encounter (Signed)
lft vm for pt to call ofc to give some information on referral to Dr Maryruth Bun office. Pt was excepted and to call and schedule at [phone].

## 2020-08-03 ENCOUNTER — Ambulatory Visit: Payer: Federal, State, Local not specified - PPO | Admitting: Cardiology

## 2020-08-08 ENCOUNTER — Telehealth: Payer: Self-pay | Admitting: Family Medicine

## 2020-08-08 NOTE — Telephone Encounter (Signed)
Patient called, patient's son passed away in 07-05-2023. She is still suffering from her grief and is not ready to go back to work. Her work is saying she needs to come back. Patient is requesting a work excuse, Patient was seen by Dr. Shirlee Latch on 07/24/2020.
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
## 2020-08-09 NOTE — Telephone Encounter (Signed)
Called and spoke with the Patient and she states that she will get scheduled for psychology and psychiatry.  She states that psychiatry only has an appointment in February.  She will call to see if they maybe able to see her sooner.  She is needing FMLA paperwork filled out

## 2020-08-13 NOTE — Telephone Encounter (Signed)
Patient was informed that she would need to make an appointment with Psychiatry before her appointment with our office on 12/29 at 10:00 am.  Patient was informed to bring the [facility] paperwork in with her and we would complete this paperwork from November 2021 until her appointment with Psychiatry.   Patient verbalized understanding

## 2020-08-15 ENCOUNTER — Ambulatory Visit (INDEPENDENT_AMBULATORY_CARE_PROVIDER_SITE_OTHER): Payer: Federal, State, Local not specified - PPO | Admitting: Internal Medicine

## 2020-08-15 ENCOUNTER — Encounter: Payer: Self-pay | Admitting: Internal Medicine

## 2020-08-15 ENCOUNTER — Other Ambulatory Visit: Payer: Self-pay

## 2020-08-15 VITALS — BP 124/84 | HR 74 | Temp 98.4°F | Ht 60.0 in | Wt 189.0 lb

## 2020-08-15 DIAGNOSIS — F410 Panic disorder [episodic paroxysmal anxiety] without agoraphobia: Secondary | ICD-10-CM

## 2020-08-15 DIAGNOSIS — R238 Other skin changes: Secondary | ICD-10-CM

## 2020-08-15 DIAGNOSIS — F4321 Adjustment disorder with depressed mood: Secondary | ICD-10-CM | POA: Diagnosis not present

## 2020-08-15 DIAGNOSIS — F419 Anxiety disorder, unspecified: Secondary | ICD-10-CM

## 2020-08-15 DIAGNOSIS — F32A Depression, unspecified: Secondary | ICD-10-CM

## 2020-08-15 DIAGNOSIS — F515 Nightmare disorder: Secondary | ICD-10-CM

## 2020-08-15 DIAGNOSIS — E559 Vitamin D deficiency, unspecified: Secondary | ICD-10-CM

## 2020-08-15 DIAGNOSIS — R233 Spontaneous ecchymoses: Secondary | ICD-10-CM

## 2020-08-15 DIAGNOSIS — R0789 Other chest pain: Secondary | ICD-10-CM

## 2020-08-15 DIAGNOSIS — R002 Palpitations: Secondary | ICD-10-CM | POA: Diagnosis not present

## 2020-08-15 DIAGNOSIS — G4700 Insomnia, unspecified: Secondary | ICD-10-CM

## 2020-08-15 DIAGNOSIS — E538 Deficiency of other specified B group vitamins: Secondary | ICD-10-CM | POA: Diagnosis not present

## 2020-08-15 DIAGNOSIS — R0602 Shortness of breath: Secondary | ICD-10-CM

## 2020-08-15 LAB — COMPREHENSIVE METABOLIC PANEL
ALT: 10 U/L (ref 0–35)
AST: 12 U/L (ref 0–37)
Albumin: 4.2 g/dL (ref 3.5–5.2)
Alkaline Phosphatase: 66 U/L (ref 39–117)
BUN: 16 mg/dL (ref 6–23)
CO2: 27 mEq/L (ref 19–32)
Calcium: 9 mg/dL (ref 8.4–10.5)
Chloride: 103 mEq/L (ref 96–112)
Creatinine, Ser: 0.95 mg/dL (ref 0.40–1.20)
GFR: 74.71 mL/min (ref >60.00)
Glucose, Bld: 107 mg/dL — ABNORMAL HIGH (ref 70–99)
Potassium: 4 mEq/L (ref 3.5–5.1)
Sodium: 138 mEq/L (ref 135–145)
Total Bilirubin: 0.8 mg/dL (ref 0.2–1.2)
Total Protein: 6.6 g/dL (ref 6.0–8.3)

## 2020-08-15 LAB — CBC WITH DIFFERENTIAL/PLATELET
Basophils Absolute: 0 10*3/uL (ref 0.0–0.1)
Basophils Relative: 0.3 % (ref 0.0–3.0)
Eosinophils Absolute: 0.1 10*3/uL (ref 0.0–0.7)
Eosinophils Relative: 1.1 % (ref 0.0–5.0)
HCT: 40.6 % (ref 36.0–46.0)
Hemoglobin: 13.7 g/dL (ref 12.0–15.0)
Lymphocytes Relative: 32.2 % (ref 12.0–46.0)
Lymphs Abs: 2.2 10*3/uL (ref 0.7–4.0)
MCHC: 33.6 g/dL (ref 30.0–36.0)
MCV: 87.6 fl (ref 78.0–100.0)
Monocytes Absolute: 0.4 10*3/uL (ref 0.1–1.0)
Monocytes Relative: 6.4 % (ref 3.0–12.0)
Neutro Abs: 4.2 10*3/uL (ref 1.4–7.7)
Neutrophils Relative %: 60 % (ref 43.0–77.0)
Platelets: 237 10*3/uL (ref 150.0–400.0)
RBC: 4.63 Mil/uL (ref 3.87–5.11)
RDW: 12.9 % (ref 11.5–15.5)
WBC: 6.9 10*3/uL (ref 4.0–10.5)

## 2020-08-15 LAB — PROTIME-INR
INR: 1 ratio (ref 0.8–1.0)
Prothrombin Time: 11.3 s (ref 9.6–13.1)

## 2020-08-15 LAB — VITAMIN D 25 HYDROXY (VIT D DEFICIENCY, FRACTURES): VITD: 22.19 ng/mL — ABNORMAL LOW (ref 30.00–100.00)

## 2020-08-15 LAB — APTT: aPTT: 30.7 s (ref 23.4–32.7)

## 2020-08-15 LAB — VITAMIN B12: Vitamin B-12: 252 pg/mL (ref 211–911)
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
## 2020-08-15 NOTE — Patient Instructions (Addendum)
Dr. Maryruth Bun psychiatry [phone] call daily for cancellations   excedrin migraine or tension headache  advil as needed  Magnesium 250 mg daily    Tension Headache, Adult A tension headache is a feeling of pain, pressure, or aching in the head that is often felt over the front and sides of the head. The pain can be dull, or it can feel tight (constricting). There are two types of tension headache:  Episodic tension headache. This is when the headaches happen fewer than 15 days a month.  Chronic tension headache. This is when the headaches happen more than 15 days a month during a 60-month period. A tension headache can last from 30 minutes to several days. It is the most common kind of headache. Tension headaches are not normally associated with nausea or vomiting, and they do not get worse with physical activity. What are the causes? The exact cause of this condition is not known. Tension headaches are often triggered by stress, anxiety, or depression. Other triggers include:  Alcohol.  Too much caffeine or caffeine withdrawal.  Respiratory infections, such as colds, flu, or sinus infections.  Dental problems or teeth clenching.  Tiredness (fatigue).  Holding your head and neck in the same position for a long period of time, such as while using a computer.  Smoking.  Arthritis of the neck. What are the signs or symptoms? Symptoms of this condition include:  A feeling of pressure or tightness around the head.  Dull, aching head pain.  Pain over the front and sides of the head.  Tenderness in the muscles of the head, neck, and shoulders. How is this diagnosed? This condition may be diagnosed based on your symptoms, your medical history, and a physical exam. If your symptoms are severe or unusual, you may have imaging tests, such as a CT scan or an MRI of your head. Your vision may also be checked. How is this treated? This condition may be treated with lifestyle changes and  with medicines that help relieve symptoms. Follow these instructions at home: Managing pain  Take over-the-counter and prescription medicines only as told by your health care provider.  When you have a headache, lie down in a dark, quiet room.  If directed, apply ice to the head and neck: ? Put ice in a plastic bag. ? Place a towel between your skin and the bag. ? Leave the ice on for 20 minutes, 2-3 times a day.  If directed, apply heat to the back of your neck as often as told by your health care provider. Use the heat source that your health care provider recommends, such as a moist heat pack or a heating pad. ? Place a towel between your skin and the heat source. ? Leave the heat on for 20-30 minutes. ? Remove the heat if your skin turns bright red. This is especially important if you are unable to feel pain, heat, or cold. You may have a greater risk of getting burned. Eating and drinking  Eat meals on a regular schedule.  Limit alcohol intake to no more than 1 drink a day for nonpregnant women and 2 drinks a day for men. One drink equals 12 oz of beer, 5 oz of wine, or 1 oz of hard liquor.  Drink enough fluid to keep your urine pale yellow.  Decrease your caffeine intake, or stop using caffeine. Lifestyle  Get 7-9 hours of sleep each night, or get the amount of sleep recommended by your health  care provider.  At bedtime, remove all electronic devices from your room. Electronic devices include computers, phones, and tablets.  Find ways to manage your stress. Some things that can help relieve stress include: ? Exercise. ? Deep breathing exercises. ? Yoga. ? Listening to music. ? Positive mental imagery.  Try to sit up straight and avoid tensing your muscles.  Do not use any products that contain nicotine or tobacco, such as cigarettes and e-cigarettes. If you need help quitting, ask your health care provider. General instructions   Keep all follow-up visits as told  by your health care provider. This is important.  Avoid any headache triggers. Keep a headache journal to help find out what may trigger your headaches. For example, write down: ? What you eat and drink. ? How much sleep you get. ? Any change to your diet or medicines. Contact a health care provider if:  Your headache does not get better.  Your headache comes back.  You are sensitive to sounds, light, or smells because of a headache.  You have nausea or you vomit.  Your stomach hurts. Get help right away if:  You suddenly develop a very severe headache along with any of the following: ? A stiff neck. ? Nausea and vomiting. ? Confusion. ? Weakness. ? Double vision or loss of vision. ? Shortness of breath. ? Rash. ? Unusual sleepiness. ? Fever. ? Trouble speaking. ? Pain in your eyes or ears. ? Trouble walking or balancing. ? Feeling faint or passing out. Summary  A tension headache is a feeling of pain, pressure, or aching in the head that is often felt over the front and sides of the head.  A tension headache can last from 30 minutes to several days. It is the most common kind of headache.  This condition may be diagnosed based on your symptoms, your medical history, and a physical exam.  This condition may be treated with lifestyle changes and with medicines that help relieve symptoms. This information is not intended to replace advice given to you by your health care provider. Make sure you discuss any questions you have with your health care provider. Document Revised: 06/01/2019 Document Reviewed: 11/14/2016 Elsevier Patient Education  2020 [employer].

## 2020-08-16 NOTE — Telephone Encounter (Signed)
Fmla paperwork filled out pt to be out until 10/22/20 further time off since out since 07/06/20 to be determined by psychiatry Dr. Maryruth Bun

## 2020-08-21 ENCOUNTER — Ambulatory Visit: Payer: Federal, State, Local not specified - PPO | Admitting: Cardiology

## 2020-08-21 NOTE — Telephone Encounter (Signed)
Pt needs a work note from being out from Nov 19th to March 7th these are the dates on the [facility] paperwork per pt  If you have any questions please call patient

## 2020-08-22 ENCOUNTER — Ambulatory Visit (INDEPENDENT_AMBULATORY_CARE_PROVIDER_SITE_OTHER): Payer: Federal, State, Local not specified - PPO | Admitting: Psychology

## 2020-08-22 ENCOUNTER — Telehealth: Payer: Self-pay | Admitting: Family Medicine

## 2020-08-22 DIAGNOSIS — F430 Acute stress reaction: Secondary | ICD-10-CM

## 2020-08-22 NOTE — Telephone Encounter (Signed)
Patient called in about her mychart message about her form she stated that it was not there

## 2020-08-22 NOTE — Telephone Encounter (Signed)
Spoke with Patient and we were unable to get the letter loaded on her mychart. She will come in to the office tomorrow to pick up the signed physical copy.

## 2020-08-22 NOTE — Telephone Encounter (Signed)
Patient informed of the physical copy and will come in office to pick this up.

## 2020-08-23 ENCOUNTER — Ambulatory Visit (INDEPENDENT_AMBULATORY_CARE_PROVIDER_SITE_OTHER): Payer: Federal, State, Local not specified - PPO | Admitting: Psychology

## 2020-08-23 DIAGNOSIS — F430 Acute stress reaction: Secondary | ICD-10-CM

## 2020-08-24 ENCOUNTER — Telehealth: Payer: Self-pay

## 2020-08-24 ENCOUNTER — Telehealth: Payer: Self-pay | Admitting: Family Medicine

## 2020-08-24 NOTE — Telephone Encounter (Signed)
Please see previous telephone encounter. Patient will come by today to pick up physical signed copy of note that was drafted. Note is also in patient's chart.

## 2020-08-24 NOTE — Telephone Encounter (Signed)
Patient is requesting another note from Dr. Shirlee Latch, since she did her FMLA paper work. This note is to back up FMLA paper work for reasonable accommodations.

## 2020-08-24 NOTE — Telephone Encounter (Signed)
Noted  

## 2020-08-24 NOTE — Telephone Encounter (Signed)
Pt came in and asked if Dr TMS would be willing to sign off on a reasonable accommodation form. Pt states that her boss is giving her a hard time about coming back to work after the recent loss of her son. She knows she needs to go back to work but would like to have some limits for the time being while she is there. She states that she would like to be able to do telehealth appts from home. She is worried about being at work with a patient and having a panic attack and accidentally mess up on procedures. Pt would like a call back to explain and clarify what exactly she is going to do and what she needs from the Dr. Marland KitchenPlease advise

## 2020-08-24 NOTE — Telephone Encounter (Signed)
Spoke with dr French Ana McLean-Scocuzza and she agrees to do paperwork with fee applied.   Called and informed Patient. She is agreeable to this fee. She states that her work first needs a not of recommendation for patient to be accommodated and then they will send over official paper work.   Dr French Ana McLean-Scocuzza informed and stated that a letter is okay.  I have typed, printed, and placed this on Dr French Ana McLean-Scocuzza desk to sign.

## 2020-08-24 NOTE — Telephone Encounter (Signed)
Letter ready for pick up

## 2020-08-24 NOTE — Telephone Encounter (Signed)
Patient is coming by office in the next 1 hr or 2 to pick up letter.

## 2020-08-24 NOTE — Telephone Encounter (Signed)
Patient informed and verbalized understanding.  She will come and get letter now

## 2020-08-28 ENCOUNTER — Other Ambulatory Visit: Payer: Self-pay

## 2020-08-28 ENCOUNTER — Telehealth: Payer: Self-pay | Admitting: Family Medicine

## 2020-08-28 NOTE — Telephone Encounter (Signed)
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 Patient dropped out for a death in her family. She is getting ready to go to school in May. Can Dr. Birdie Sons use the physical from [place] to fill out nursing paperwork for her, the school will be [employer].  Farrie Sann,cma

## 2020-08-28 NOTE — Telephone Encounter (Signed)
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 Patient dropped out for a death in her family. She is getting ready to go to school in May. Can Dr. Birdie Sons use the physical from [place] to fill out nursing paperwork for her, the school will be [employer].

## 2020-08-29 ENCOUNTER — Ambulatory Visit (INDEPENDENT_AMBULATORY_CARE_PROVIDER_SITE_OTHER): Payer: Federal, State, Local not specified - PPO | Admitting: Psychology

## 2020-08-29 DIAGNOSIS — F430 Acute stress reaction: Secondary | ICD-10-CM | POA: Diagnosis not present

## 2020-08-29 NOTE — Telephone Encounter (Signed)
I would need to see the paperwork that she needs first.  Depending on what they are requesting we may be able to use the prior physical.

## 2020-08-29 NOTE — Telephone Encounter (Signed)
I called the patient and she stated that she decided to wait on the school thing encase she is just simply not ready after all.  I informed her that if she changed her mind to let us know and she understood. Dametria Tuzzolino,cma

## 2020-09-03 ENCOUNTER — Ambulatory Visit (INDEPENDENT_AMBULATORY_CARE_PROVIDER_SITE_OTHER): Payer: Federal, State, Local not specified - PPO | Admitting: Psychology

## 2020-09-03 DIAGNOSIS — F430 Acute stress reaction: Secondary | ICD-10-CM | POA: Diagnosis not present

## 2020-09-05 ENCOUNTER — Ambulatory Visit: Payer: Federal, State, Local not specified - PPO | Admitting: Psychology

## 2020-09-07 ENCOUNTER — Ambulatory Visit: Payer: Federal, State, Local not specified - PPO | Admitting: Cardiology

## 2020-09-10 ENCOUNTER — Ambulatory Visit (INDEPENDENT_AMBULATORY_CARE_PROVIDER_SITE_OTHER): Payer: Federal, State, Local not specified - PPO | Admitting: Psychology

## 2020-09-10 DIAGNOSIS — F430 Acute stress reaction: Secondary | ICD-10-CM | POA: Diagnosis not present

## 2020-09-17 ENCOUNTER — Ambulatory Visit (INDEPENDENT_AMBULATORY_CARE_PROVIDER_SITE_OTHER): Payer: Federal, State, Local not specified - PPO

## 2020-09-17 ENCOUNTER — Other Ambulatory Visit: Payer: Self-pay

## 2020-09-17 ENCOUNTER — Encounter: Payer: Self-pay | Admitting: Cardiology

## 2020-09-17 ENCOUNTER — Ambulatory Visit (INDEPENDENT_AMBULATORY_CARE_PROVIDER_SITE_OTHER): Payer: Federal, State, Local not specified - PPO | Admitting: Psychology

## 2020-09-17 ENCOUNTER — Ambulatory Visit (INDEPENDENT_AMBULATORY_CARE_PROVIDER_SITE_OTHER): Payer: Federal, State, Local not specified - PPO | Admitting: Cardiology

## 2020-09-17 VITALS — BP 118/80 | HR 63 | Ht 60.0 in | Wt 189.5 lb

## 2020-09-17 DIAGNOSIS — R0602 Shortness of breath: Secondary | ICD-10-CM

## 2020-09-17 DIAGNOSIS — F419 Anxiety disorder, unspecified: Secondary | ICD-10-CM

## 2020-09-17 DIAGNOSIS — R002 Palpitations: Secondary | ICD-10-CM

## 2020-09-17 DIAGNOSIS — F430 Acute stress reaction: Secondary | ICD-10-CM | POA: Diagnosis not present

## 2020-09-17 NOTE — Patient Instructions (Signed)
Medication Instructions:   Your physician recommends that you continue on your current medications as directed. Please refer to the Current Medication list given to you today.  *If you need a refill on your cardiac medications before your next appointment, please call your pharmacy*   Lab Work: None Ordered If you have labs (blood work) drawn today and your tests are completely normal, you will receive your results only by: . MyChart Message (if you have MyChart) OR . A paper copy in the mail If you have any lab test that is abnormal or we need to change your treatment, we will call you to review the results.   Testing/Procedures:  1)  Your physician has requested that you have an echocardiogram. Echocardiography is a painless test that uses sound waves to create images of your heart. It provides your doctor with information about the size and shape of your heart and how well your heart's chambers and valves are working. This procedure takes approximately one hour. There are no restrictions for this procedure.   2)  Your physician has recommended that you wear a Zio monitor for 2 weeks . This monitor is a medical device that records the heart's electrical activity. Doctors most often use these monitors to diagnose arrhythmias. Arrhythmias are problems with the speed or rhythm of the heartbeat. The monitor is a small device applied to your chest. You can wear one while you do your normal daily activities. While wearing this monitor if you have any symptoms to push the button and record what you felt. Once you have worn this monitor for the period of time provider prescribed (Usually 14 days), you will return the monitor device in the postage paid box. Once it is returned they will download the data collected and provide us with a report which the provider will then review and we will call you with those results. Important tips:  1. Avoid showering during the first 24 hours of wearing the  monitor. 2. Avoid excessive sweating to help maximize wear time. 3. Do not submerge the device, no hot tubs, and no swimming pools. 4. Keep any lotions or oils away from the patch. 5. After 24 hours you may shower with the patch on. Take brief showers with your back facing the shower head.  6. Do not remove patch once it has been placed because that will interrupt data and decrease adhesive wear time. 7. Push the button when you have any symptoms and write down what you were feeling. 8. Once you have completed wearing your monitor, remove and place into box which has postage paid and place in your outgoing mailbox.  9. If for some reason you have misplaced your box then call our office and we can provide another box and/or mail it off for you.          Follow-Up: At [facility], you and your health needs are our priority.  As part of our continuing mission to provide you with exceptional heart care, we have created designated Provider Care Teams.  These Care Teams include your primary Cardiologist (physician) and Advanced Practice Providers (APPs -  Physician Assistants and Nurse Practitioners) who all work together to provide you with the care you need, when you need it.  We recommend signing up for the patient portal called "MyChart".  Sign up information is provided on this After Visit Summary.  MyChart is used to connect with patients for Virtual Visits (Telemedicine).  Patients are able to view lab/test   results, encounter notes, upcoming appointments, etc.  Non-urgent messages can be sent to your provider as well.   To learn more about what you can do with MyChart, go to [email or web address].    Your next appointment:   5 week(s)  The format for your next appointment:   In Person  Provider:   Brian Agbor-Etang, MD   Other Instructions   

## 2020-09-17 NOTE — Progress Notes (Signed)
Cardiology Office Note:    Date:  09/17/2020   ID:  Stacy Yang, DOB 09/23/1980, MRN [id number]  PCP:  Glori Luis, MD  [facility] Cardiologist:  Debbe Odea, MD  [facility] Electrophysiologist:  None   Referring MD: McLean-Scocuzza, French Ana *   Chief Complaint  Patient presents with  . Other    Chest pain, Palpitations/Dizziness. Meds reviewed verbally with pt.   Stacy Yang is a 40 y.o. female who is being seen today for the evaluation of palpitations at the request of McLean-Scocuzza, French Ana *.   History of Present Illness:    Stacy Yang is a 40 y.o. 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  She also has some about 3 months ago and has been feeling more anxious since then.  She states having symptoms of palpitations associated with dizziness.  She is a therapist, scheduled to see psychiatry next month.  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  Has some shortness of breath when she exerts herself.  She is worried this might have returned.  Smoking, denies any family history of heart disease.  Past Medical History:  Diagnosis Date  . Acne   . Asthma   . Cardiomyopathy (HCC)   . Family history of lung cancer   . Family history of pancreatic cancer     Past Surgical History:  Procedure Laterality Date  . CESAREAN SECTION     x3  . COLONOSCOPY WITH PROPOFOL N/A 11/07/2019   Procedure: COLONOSCOPY WITH PROPOFOL;  Surgeon: Toney Reil, MD;  Location[facility] ENDOSCOPY;  Service: Gastroenterology;  Laterality: N/A;  . ESOPHAGOGASTRODUODENOSCOPY (EGD) WITH PROPOFOL N/A 07/15/2017   Procedure: ESOPHAGOGASTRODUODENOSCOPY (EGD) WITH PROPOFOL;  Surgeon: Toney Reil, MD;  Location: [facility];  Service: Endoscopy;  Laterality: N/A;  . TUBAL LIGATION      Current Medications: Current Meds  Medication Sig  . albuterol (PROVENTIL HFA;VENTOLIN  HFA) 108 (90 Base) MCG/ACT inhaler Inhale 2 puffs into the lungs every 6 (six) hours as needed for wheezing or shortness of breath.  . citalopram (CELEXA) 10 MG tablet Take 1 tablet (10 mg total) by mouth daily.  . Fluticasone-Salmeterol (AIRDUO RESPICLICK 113/14) 113-14 MCG/ACT AEPB Inhale 1 puff into the lungs 2 (two) times daily.  Marland Kitchen MELATONIN GUMMIES PO Take by mouth as needed.     Allergies:   Elemental sulfur, Iodinated diagnostic agents, and Iodine   Social History   Socioeconomic History  . Marital status: Divorced    Spouse name: Not on file  . Number of children: Not on file  . Years of education: Not on file  . Highest education level: Not on file  Occupational History  . Not on file  Tobacco Use  . Smoking status: Never Smoker  . Smokeless tobacco: Never Used  Vaping Use  . Vaping Use: Never used  Substance and Sexual Activity  . Alcohol use: No  . Drug use: No  . Sexual activity: Yes  Other Topics Concern  . Not on file  Social History Narrative   Dental asst at the [place]   Daughters    Son died Jul 14, 2020 GSW   1 grandaughter 2 y.o as of 07/24/20    Social Determinants of Health   Financial Resource Strain: Not on file  Food Insecurity: Not on file  Transportation Needs: Not on file  Physical Activity: Not on file  Stress: Not on file  Social Connections: Not on file     Family History: The patient's family history includes Alcohol abuse in her father; Cirrhosis in her paternal grandfather; Dementia in her paternal grandmother; Diabetes in her maternal aunt and maternal grandmother; Kidney disease in her mother; Lung cancer in her father; Pancreatic cancer in her paternal grandfather and paternal uncle.  ROS:   Please see the history of present illness.     All other systems reviewed and are negative.  EKGs/Labs/Other Studies Reviewed:    The following studies were reviewed today:   EKG:  EKG is  ordered today.  The ekg ordered today demonstrates normal  sinus rhythm, normal ECG  Recent Labs: 10/10/2019: TSH 1.53 08/15/2020: ALT 10; BUN 16; Creatinine, Ser 0.95; Hemoglobin 13.7; Platelets 237.0; Potassium 4.0; Sodium 138  Recent Lipid Panel No results found for: CHOL, TRIG, HDL, CHOLHDL, VLDL, LDLCALC, LDLDIRECT   Risk Assessment/Calculations:      Physical Exam:    VS:  BP 118/80 (BP Location: Left Arm, Patient Position: Sitting, Cuff Size: Large)   Pulse 63   Ht 5' (1.524 m)   Wt 189 lb 8 oz (86 kg)   SpO2 99%   BMI 37.01 kg/m     Wt Readings from Last 3 Encounters:  09/17/20 189 lb 8 oz (86 kg)  08/15/20 189 lb (85.7 kg)  07/24/20 187 lb 3.2 oz (84.9 kg)     GEN:  Well nourished, well developed in no acute distress HEENT: Normal NECK: No JVD; No carotid bruits LYMPHATICS: No lymphadenopathy CARDIAC: RRR, no murmurs, rubs, gallops RESPIRATORY:  Clear to auscultation without rales, wheezing or rhonchi  ABDOMEN: Soft, non-tender, non-distended MUSCULOSKELETAL:  No edema; No deformity  SKIN: Warm and dry NEUROLOGIC:  Alert and oriented x 3 PSYCHIATRIC:  Normal affect   ASSESSMENT:    1. Palpitations   2. Shortness of breath   3. Anxiety    PLAN:    In order of problems listed above:  1. Patient with palpitations, sometimes associated with dizziness.  Orthostatic vitals in the office today with no evidence for orthostasis.  Her symptoms could be anxiety related.  We will place a 2-week cardiac monitor to evaluate for any significant arrhythmias.  If this is normal, patient will be reassured. 2. Shortness of breath, history of peripartum cardiomyopathy.  Get echocardiogram to evaluate systolic and diastolic function. 3. Anxiety, grief, management as per primary care, psychiatry.  Follow-up after echo and cardiac monitor.   Medication Adjustments/Labs and Tests Ordered: Current medicines are reviewed at length with the patient today.  Concerns regarding medicines are outlined above.  Orders Placed This Encounter   Procedures  . LONG TERM MONITOR (3-14 DAYS)  . EKG 12-Lead  . ECHOCARDIOGRAM COMPLETE   No orders of the defined types were placed in this encounter.   Patient Instructions  Medication Instructions:  Your physician recommends that you continue on your current medications as directed. Please refer to the Current Medication list given to you today.  *If you need a refill on your cardiac medications before your next appointment, please call your pharmacy*   Lab Work: None Ordered If you have labs (blood work) drawn today and your tests are completely normal, you will receive your results only by: Marland Kitchen MyChart Message (if you have MyChart) OR . A paper copy in the mail If you have any lab test that is abnormal or we need to change your treatment, we will call you to review the results.   Testing/Procedures:  1.  Your physician has requested that you have an echocardiogram. Echocardiography is a painless test that uses sound waves to create images of your heart. It provides your doctor with information about the size and shape of your heart and how well your heart's chambers and valves are working. This procedure takes approximately one hour. There are no restrictions for this procedure.  2.  Your physician has recommended that you wear a Zio monitor for 2 weeks. This monitor is a medical device that records the heart's electrical activity. Doctors most often use these monitors to diagnose arrhythmias. Arrhythmias are problems with the speed or rhythm of the heartbeat. The monitor is a small device applied to your chest. You can wear one while you do your normal daily activities. While wearing this monitor if you have any symptoms to push the button and record what you felt. Once you have worn this monitor for the period of time provider prescribed (Usually 14 days), you will return the monitor device in the postage paid box. Once it is returned they will download the data collected and provide  [place] with a report which the provider will then review and we will call you with those results. Important tips:  1. Avoid showering during the first 24 hours of wearing the monitor. 2. Avoid excessive sweating to help maximize wear time. 3. Do not submerge the device, no hot tubs, and no swimming pools. 4. Keep any lotions or oils away from the patch. 5. After 24 hours you may shower with the patch on. Take brief showers with your back facing the shower head.  6. Do not remove patch once it has been placed because that will interrupt data and decrease adhesive wear time. 7. Push the button when you have any symptoms and write down what you were feeling. 8. Once you have completed wearing your monitor, remove and place into box which has postage paid and place in your outgoing mailbox.  9. If for some reason you have misplaced your box then call our office and we can provide another box and/or mail it off for you.           Follow-Up: At [facility], you and your health needs are our priority.  As part of our continuing mission to provide you with exceptional heart care, we have created designated Provider Care Teams.  These Care Teams include your primary Cardiologist (physician) and Advanced Practice Providers (APPs -  Physician Assistants and Nurse Practitioners) who all work together to provide you with the care you need, when you need it.  We recommend signing up for the patient portal called "MyChart".  Sign up information is provided on this After Visit Summary.  MyChart is used to connect with patients for Virtual Visits (Telemedicine).  Patients are able to view lab/test results, encounter notes, upcoming appointments, etc.  Non-urgent messages can be sent to your provider as well.   To learn more about what you can do with MyChart, go to [email or web address].    Your next appointment:   5 week(s)  The format for your next appointment:   In Person  Provider:   Debbe Odea, MD   Other Instructions      Signed, Debbe Odea, MD  09/17/2020 12:56 PM    [facility]

## 2020-09-18 ENCOUNTER — Encounter: Payer: Self-pay | Admitting: Internal Medicine

## 2020-09-18 NOTE — Telephone Encounter (Signed)
Letter ready for me to sign please print

## 2020-09-18 NOTE — Telephone Encounter (Signed)
Patient informed and verbalized understanding.  She will print a copy off mychart. Signed physical copy mailed to address verified by the Patient

## 2020-09-18 NOTE — Telephone Encounter (Signed)
Patient stated she has an appointment to see psychiatry on February 18th.  The patient stated that the letter that Dr. French Ana wrote was fine it just needs to have a line that states the patients cannot do face-to-face patient care.  And that's all.  Joenathan Sakuma,cma

## 2020-09-18 NOTE — Telephone Encounter (Signed)
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 She also said they are trying to get the reasonable accommodation form done for her job. She would like a call back when it is ready.   Ammon Muscatello,cma

## 2020-09-18 NOTE — Telephone Encounter (Signed)
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 She also said they are trying to get the reasonable accommodation form done for her job. She would like a call back when it is ready.

## 2020-09-18 NOTE — Telephone Encounter (Signed)
I have not previously seen her for this issue. Can we get her scheduled for follow-up to discuss this? Does she have an appointment with psychiatry?

## 2020-09-19 ENCOUNTER — Other Ambulatory Visit: Payer: Federal, State, Local not specified - PPO

## 2020-09-24 ENCOUNTER — Ambulatory Visit (INDEPENDENT_AMBULATORY_CARE_PROVIDER_SITE_OTHER): Payer: Federal, State, Local not specified - PPO | Admitting: Psychology

## 2020-09-24 DIAGNOSIS — F430 Acute stress reaction: Secondary | ICD-10-CM

## 2020-10-01 ENCOUNTER — Ambulatory Visit (INDEPENDENT_AMBULATORY_CARE_PROVIDER_SITE_OTHER): Payer: Federal, State, Local not specified - PPO | Admitting: Psychology

## 2020-10-01 DIAGNOSIS — F430 Acute stress reaction: Secondary | ICD-10-CM | POA: Diagnosis not present

## 2020-10-02 ENCOUNTER — Other Ambulatory Visit: Payer: Self-pay

## 2020-10-02 ENCOUNTER — Ambulatory Visit (INDEPENDENT_AMBULATORY_CARE_PROVIDER_SITE_OTHER): Payer: Federal, State, Local not specified - PPO

## 2020-10-02 DIAGNOSIS — R0602 Shortness of breath: Secondary | ICD-10-CM | POA: Diagnosis not present

## 2020-10-02 LAB — ECHOCARDIOGRAM COMPLETE
AR max vel: 2.39 cm2
AV Area VTI: 2.21 cm2
AV Area mean vel: 2.44 cm2
AV Mean grad: 3 mmHg
AV Peak grad: 5.6 mmHg
Ao pk vel: 1.18 m/s
Area-P 1/2: 3.42 cm2
Calc EF: 51.8 %
S' Lateral: 3.1 cm
Single Plane A2C EF: 52 %
Single Plane A4C EF: 52.5 %

## 2020-10-03 ENCOUNTER — Encounter: Payer: Self-pay | Admitting: Internal Medicine

## 2020-10-04 ENCOUNTER — Encounter: Payer: Self-pay | Admitting: Family Medicine

## 2020-10-04 ENCOUNTER — Other Ambulatory Visit: Payer: Self-pay

## 2020-10-04 ENCOUNTER — Ambulatory Visit (INDEPENDENT_AMBULATORY_CARE_PROVIDER_SITE_OTHER): Payer: Federal, State, Local not specified - PPO | Admitting: Family Medicine

## 2020-10-04 DIAGNOSIS — F4321 Adjustment disorder with depressed mood: Secondary | ICD-10-CM

## 2020-10-04 NOTE — Patient Instructions (Signed)
Nice to see you. Please see the psychiatrist as planned. I will get your paperwork filled out. Please call the following phone number for hospice grief counseling: [phone]

## 2020-10-04 NOTE — Assessment & Plan Note (Addendum)
The patient has fairly significant grief with anxiety and depression playing a role.  No SI.  I offered support.  I certainly think is reasonable for her to work in a nonpatient facing role if they are able to accommodate this at her job.  I discussed that psychiatry would be able to provide additional recommendations regarding her work status when she sees them tomorrow.  She will continue on Celexa.  I discussed that the Celexa would likely provide additional benefit over the next 3 to 5 weeks.  I provided her with the phone number for hospice grief counseling.  She will continue to see her therapist.  We will follow-up in about 4 weeks.

## 2020-10-04 NOTE — Progress Notes (Signed)
[name], MD Phone: [phone]  Stacy Yang is a 40 y.o. female who presents today for f/u.  Grief/anxiety/depression: The patient's son shot himself in the head on 07/06/2020.  She has been intermittently seeing Dr. Judie Grieve for follow-up on grief, anxiety, and depression.  She presents for follow-up of this issue today.  She is still having a hard time.  She feels tired and exhausted even if she has felt as though she has slept well.  She feels like she could take a nap during the day.  Some nights she does not sleep at all.  She notes right after he passed away she was having severe anxiety and panic attacks.  She had difficulty going into crowds.  She notes she is not back to 100% normal though it is better than it was then.  She has some spells where she just cries.  She has trouble finding joy and happiness in anything.  She notes her work gave her 30 days unpaid time off though they advised her that she had to come back to work after that.  She mentally does not feel ready to be back at work.  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  She has had to work in patient facing roles and notes working in peoples mouths triggers flashbacks to seeing her son right after he passed away with bone fragments scattered on the floor.  This triggers anxiety and panic and also crying spells.  Some nights she has nightmares.  These do not occur that frequently though it does make her scared to go to sleep at times.  Her nightmares include seeing a man shoot himself and then also a little girl try to cut her neck in these dreams.  She worries about her kids when they leave the house.  She notes she is being more forgetful as well.  She reports a breakdown yesterday at work when a patient asked her about her kids.  She has times when she is driving where she  gets incredibly anxious and pulls over to the side of the road to try to calm down.  She denies SI.  She has been on Celexa for about 3 weeks and feels as though this has provided some benefit already.  She only takes lorazepam on the weekend as it does make her drowsy the next day.  She describes her panic attack symptoms as heart racing, shortness of breath, cannot catch her breath, and being in a state of confusion.  She sees psychiatry tomorrow.  Social History   Tobacco Use  Smoking Status Never Smoker  Smokeless Tobacco Never Used    Current Outpatient Medications on File Prior to Visit  Medication Sig Dispense Refill  . albuterol (PROVENTIL HFA;VENTOLIN HFA) 108 (90 Base) MCG/ACT inhaler Inhale 2 puffs into the lungs every 6 (six) hours as needed for wheezing or shortness of breath. 2 Inhaler 2  . citalopram (CELEXA) 10 MG tablet Take 1 tablet (10 mg total) by mouth daily. 90 tablet 0  . Fluticasone-Salmeterol (AIRDUO RESPICLICK 113/14) 113-14 MCG/ACT AEPB Inhale 1 puff into the lungs 2 (two) times daily. 1 each 10  . LORazepam (ATIVAN) 0.5 MG tablet Take 0.5-1 tablets (0.25-0.5 mg total) by mouth at bedtime as needed for anxiety. 30 tablet 0  . MELATONIN GUMMIES PO Take by mouth as  needed.     No current facility-administered medications on file prior to visit.     ROS see history of present illness  Objective  Physical Exam Vitals:   10/04/20 0816  BP: 100/70  Pulse: 76  Temp: 98.6 F (37 C)  SpO2: 99%    BP Readings from Last 3 Encounters:  10/04/20 100/70  09/17/20 118/80  08/15/20 124/84   Wt Readings from Last 3 Encounters:  10/04/20 190 lb 6.4 oz (86.4 kg)  09/17/20 189 lb 8 oz (86 kg)  08/15/20 189 lb (85.7 kg)    Physical Exam Constitutional:      General: She is not in acute distress.    Appearance: She is not diaphoretic.  Cardiovascular:     Rate and Rhythm: Normal rate and regular rhythm.     Heart sounds: Normal heart sounds.  Pulmonary:      Effort: Pulmonary effort is normal.     Breath sounds: Normal breath sounds.  Musculoskeletal:        General: No edema.  Skin:    General: Skin is warm and dry.  Neurological:     Mental Status: She is alert.  Psychiatric:     Comments: Affect flat, intermittently tearful      Assessment/Plan: Please see individual problem list.  Problem List Items Addressed This Visit    Grief    The patient has fairly significant grief with anxiety and depression playing a role.  No SI.  I offered support.  I certainly think is reasonable for her to work in a nonpatient facing role if they are able to accommodate this at her job.  I discussed that psychiatry would be able to provide additional recommendations regarding her work status when she sees them tomorrow.  She will continue on Celexa.  I discussed that the Celexa would likely provide additional benefit over the next 3 to 5 weeks.  I provided her with the phone number for hospice grief counseling.  She will continue to see her therapist.  We will follow-up in about 4 weeks.          This visit occurred during the SARS-CoV-2 public health emergency.  Safety protocols were in place, including screening questions prior to the visit, additional usage of staff PPE, and extensive cleaning of exam room while observing appropriate contact time as indicated for disinfecting solutions.   I have spent 42 minutes in the care of this patient regarding history taking, documentation, completion of physical exam.   [name], MD [facility]

## 2020-10-05 DIAGNOSIS — F4322 Adjustment disorder with anxiety: Secondary | ICD-10-CM | POA: Diagnosis not present

## 2020-10-05 DIAGNOSIS — F4311 Post-traumatic stress disorder, acute: Secondary | ICD-10-CM | POA: Diagnosis not present

## 2020-10-05 DIAGNOSIS — F321 Major depressive disorder, single episode, moderate: Secondary | ICD-10-CM | POA: Diagnosis not present

## 2020-10-05 DIAGNOSIS — G4701 Insomnia due to medical condition: Secondary | ICD-10-CM | POA: Diagnosis not present

## 2020-10-08 ENCOUNTER — Ambulatory Visit (INDEPENDENT_AMBULATORY_CARE_PROVIDER_SITE_OTHER): Payer: Federal, State, Local not specified - PPO | Admitting: Psychology

## 2020-10-08 DIAGNOSIS — F430 Acute stress reaction: Secondary | ICD-10-CM | POA: Diagnosis not present

## 2020-10-11 ENCOUNTER — Telehealth: Payer: Self-pay | Admitting: Family Medicine

## 2020-10-11 NOTE — Telephone Encounter (Signed)
I faxed the accomodation form to [facility] and confirmation was given.  I called the patient to see if she was picking up the original or wanted it mailed to her home.  I LVM for her to call back and let us know.  Brandie Lopes,cma

## 2020-10-11 NOTE — Telephone Encounter (Signed)
Accommodation paperwork completed.  Given to Coralee North to fax to the [place] and provide the original to the patient.

## 2020-10-12 ENCOUNTER — Telehealth: Payer: Self-pay | Admitting: Family Medicine

## 2020-10-12 NOTE — Telephone Encounter (Signed)
Patient called in wanted to if you could fax the form to this fax number please [phone] and you can mail the original

## 2020-10-15 ENCOUNTER — Ambulatory Visit (INDEPENDENT_AMBULATORY_CARE_PROVIDER_SITE_OTHER): Payer: Federal, State, Local not specified - PPO | Admitting: Psychology

## 2020-10-15 DIAGNOSIS — F430 Acute stress reaction: Secondary | ICD-10-CM | POA: Diagnosis not present

## 2020-10-18 NOTE — Telephone Encounter (Signed)
Faxed and the original was mailed to patients address.  Stacy Yang,cma

## 2020-10-22 ENCOUNTER — Ambulatory Visit (INDEPENDENT_AMBULATORY_CARE_PROVIDER_SITE_OTHER): Payer: Federal, State, Local not specified - PPO | Admitting: Psychology

## 2020-10-22 DIAGNOSIS — F331 Major depressive disorder, recurrent, moderate: Secondary | ICD-10-CM

## 2020-10-23 DIAGNOSIS — G4701 Insomnia due to medical condition: Secondary | ICD-10-CM | POA: Diagnosis not present

## 2020-10-23 DIAGNOSIS — F321 Major depressive disorder, single episode, moderate: Secondary | ICD-10-CM | POA: Diagnosis not present

## 2020-10-23 DIAGNOSIS — F4322 Adjustment disorder with anxiety: Secondary | ICD-10-CM | POA: Diagnosis not present

## 2020-10-23 DIAGNOSIS — F4311 Post-traumatic stress disorder, acute: Secondary | ICD-10-CM | POA: Diagnosis not present

## 2020-10-24 DIAGNOSIS — G4733 Obstructive sleep apnea (adult) (pediatric): Secondary | ICD-10-CM | POA: Diagnosis not present

## 2020-10-26 ENCOUNTER — Ambulatory Visit: Payer: Federal, State, Local not specified - PPO | Admitting: Cardiology

## 2020-10-29 ENCOUNTER — Ambulatory Visit (INDEPENDENT_AMBULATORY_CARE_PROVIDER_SITE_OTHER): Payer: Federal, State, Local not specified - PPO | Admitting: Psychology

## 2020-10-29 DIAGNOSIS — F430 Acute stress reaction: Secondary | ICD-10-CM | POA: Diagnosis not present

## 2020-10-30 ENCOUNTER — Ambulatory Visit: Payer: Federal, State, Local not specified - PPO | Admitting: Family Medicine

## 2020-11-05 ENCOUNTER — Ambulatory Visit: Payer: Federal, State, Local not specified - PPO | Admitting: Psychology

## 2020-11-05 ENCOUNTER — Ambulatory Visit: Payer: Federal, State, Local not specified - PPO | Admitting: Cardiology

## 2020-11-12 ENCOUNTER — Ambulatory Visit (INDEPENDENT_AMBULATORY_CARE_PROVIDER_SITE_OTHER): Payer: Federal, State, Local not specified - PPO | Admitting: Psychology

## 2020-11-12 DIAGNOSIS — F430 Acute stress reaction: Secondary | ICD-10-CM | POA: Diagnosis not present

## 2020-11-15 ENCOUNTER — Other Ambulatory Visit: Payer: Self-pay

## 2020-11-19 ENCOUNTER — Ambulatory Visit (INDEPENDENT_AMBULATORY_CARE_PROVIDER_SITE_OTHER): Payer: Federal, State, Local not specified - PPO | Admitting: Family Medicine

## 2020-11-19 ENCOUNTER — Other Ambulatory Visit: Payer: Self-pay

## 2020-11-19 ENCOUNTER — Encounter: Payer: Self-pay | Admitting: Family Medicine

## 2020-11-19 ENCOUNTER — Ambulatory Visit: Payer: Federal, State, Local not specified - PPO | Admitting: Psychology

## 2020-11-19 DIAGNOSIS — F4310 Post-traumatic stress disorder, unspecified: Secondary | ICD-10-CM

## 2020-11-19 NOTE — Patient Instructions (Signed)
Nice to see you. I will see you back in 3 months.

## 2020-11-19 NOTE — Progress Notes (Signed)
[name], MD Phone: [phone]  Stacy Yang is a 40 y.o. female who presents today for f/u.  Anxiety/depression/PTSD: Patient notes things are improving a little bit with time.  She is seeing psychiatry and they changed her to Effexor.  She has had no physical side effects or does note having some odd dreams that are not terribly concerning to her.  She is doing therapy and is now going down to once every other week.  She denies SI.  She is staying busy and has had less crying.  Work took her reasonable accommodations into account and that has been helpful.  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  She is learning to accept what happened to her son.  She has been reading a lot about psychiatric illnesses and that has been quite helpful in her understanding what may have happened and giving her peace of mind.  She notes she will never know if her sons death was an accident or a suicide and she is learning to accept that.  Social History   Tobacco Use  Smoking Status Never Smoker  Smokeless Tobacco Never Used    Current Outpatient Medications on File Prior to Visit  Medication Sig Dispense Refill  . albuterol (PROVENTIL HFA;VENTOLIN HFA) 108 (90 Base) MCG/ACT inhaler Inhale 2 puffs into the lungs every 6 (six) hours as needed for wheezing or shortness of breath. 2 Inhaler 2  . Fluticasone-Salmeterol (AIRDUO RESPICLICK 113/14) 113-14 MCG/ACT AEPB Inhale 1 puff into the lungs 2 (two) times daily. 1 each 10  . MELATONIN GUMMIES PO Take by mouth as needed.    . venlafaxine XR (EFFEXOR-XR) 75 MG 24 hr capsule Take 75 mg by mouth daily.     No current facility-administered medications on file prior to visit.     ROS see history of present illness  Objective  Physical Exam Vitals:   11/19/20 0812  BP: 120/70  Pulse: 75  Temp: 98.3 F (36.8 C)  SpO2: 99%    BP Readings from Last 3 Encounters:  11/19/20 120/70  10/04/20 100/70  09/17/20 118/80   Wt Readings from Last 3  Encounters:  11/19/20 196 lb 3.2 oz (89 kg)  10/04/20 190 lb 6.4 oz (86.4 kg)  09/17/20 189 lb 8 oz (86 kg)    Physical Exam Constitutional:      General: She is not in acute distress.    Appearance: She is not diaphoretic.  Cardiovascular:     Rate and Rhythm: Normal rate and regular rhythm.     Heart sounds: Normal heart sounds.  Pulmonary:     Effort: Pulmonary effort is normal.     Breath sounds: Normal breath sounds.  Skin:    General: Skin is warm and dry.  Neurological:     Mental Status: She is alert.      Assessment/Plan: Please see individual problem list.  Problem List Items Addressed This Visit    PTSD (post-traumatic stress disorder)    Diagnosed by psychiatry.  She has been doing a little bit better with the medication change to Effexor.  She will continue her Effexor.  She will continue therapy.  She will continue to see psychiatry.  She will follow up with me in 3 months.      Relevant Medications   venlafaxine XR (EFFEXOR-XR) 75 MG 24 hr capsule       This visit occurred during the SARS-CoV-2 public health emergency.  Safety protocols were in place, including screening questions prior to  the visit, additional usage of staff PPE, and extensive cleaning of exam room while observing appropriate contact time as indicated for disinfecting solutions.    Tommi Rumps, MD [place]

## 2020-11-19 NOTE — Assessment & Plan Note (Signed)
Diagnosed by psychiatry.  She has been doing a little bit better with the medication change to Effexor.  She will continue her Effexor.  She will continue therapy.  She will continue to see psychiatry.  She will follow up with me in 3 months.

## 2020-11-22 DIAGNOSIS — F4311 Post-traumatic stress disorder, acute: Secondary | ICD-10-CM | POA: Diagnosis not present

## 2020-11-22 DIAGNOSIS — G4701 Insomnia due to medical condition: Secondary | ICD-10-CM | POA: Diagnosis not present

## 2020-11-22 DIAGNOSIS — F321 Major depressive disorder, single episode, moderate: Secondary | ICD-10-CM | POA: Diagnosis not present

## 2020-11-22 DIAGNOSIS — F4322 Adjustment disorder with anxiety: Secondary | ICD-10-CM | POA: Diagnosis not present

## 2020-11-26 ENCOUNTER — Ambulatory Visit (INDEPENDENT_AMBULATORY_CARE_PROVIDER_SITE_OTHER): Payer: Federal, State, Local not specified - PPO | Admitting: Psychology

## 2020-11-26 DIAGNOSIS — F430 Acute stress reaction: Secondary | ICD-10-CM | POA: Diagnosis not present

## 2020-12-03 ENCOUNTER — Ambulatory Visit: Payer: Federal, State, Local not specified - PPO | Admitting: Cardiology

## 2020-12-10 ENCOUNTER — Ambulatory Visit: Payer: Federal, State, Local not specified - PPO | Admitting: Psychology

## 2020-12-10 ENCOUNTER — Ambulatory Visit (INDEPENDENT_AMBULATORY_CARE_PROVIDER_SITE_OTHER): Payer: Federal, State, Local not specified - PPO | Admitting: Psychology

## 2020-12-10 DIAGNOSIS — F430 Acute stress reaction: Secondary | ICD-10-CM

## 2020-12-14 DIAGNOSIS — F4311 Post-traumatic stress disorder, acute: Secondary | ICD-10-CM | POA: Diagnosis not present

## 2020-12-14 DIAGNOSIS — F4322 Adjustment disorder with anxiety: Secondary | ICD-10-CM | POA: Diagnosis not present

## 2020-12-14 DIAGNOSIS — G4701 Insomnia due to medical condition: Secondary | ICD-10-CM | POA: Diagnosis not present

## 2020-12-14 DIAGNOSIS — F321 Major depressive disorder, single episode, moderate: Secondary | ICD-10-CM | POA: Diagnosis not present

## 2020-12-17 ENCOUNTER — Ambulatory Visit: Payer: Federal, State, Local not specified - PPO | Admitting: Psychology

## 2020-12-24 ENCOUNTER — Ambulatory Visit: Payer: Federal, State, Local not specified - PPO | Admitting: Cardiology

## 2020-12-24 ENCOUNTER — Other Ambulatory Visit: Payer: Self-pay | Admitting: Certified Nurse Midwife

## 2020-12-24 ENCOUNTER — Ambulatory Visit: Payer: Federal, State, Local not specified - PPO | Admitting: Psychology

## 2020-12-24 DIAGNOSIS — Z01419 Encounter for gynecological examination (general) (routine) without abnormal findings: Secondary | ICD-10-CM | POA: Diagnosis not present

## 2020-12-24 DIAGNOSIS — Z1231 Encounter for screening mammogram for malignant neoplasm of breast: Secondary | ICD-10-CM

## 2020-12-24 DIAGNOSIS — R350 Frequency of micturition: Secondary | ICD-10-CM | POA: Diagnosis not present

## 2020-12-25 ENCOUNTER — Encounter: Payer: Self-pay | Admitting: Cardiology

## 2020-12-31 ENCOUNTER — Ambulatory Visit: Payer: Federal, State, Local not specified - PPO | Admitting: Psychology

## 2021-01-07 ENCOUNTER — Ambulatory Visit (INDEPENDENT_AMBULATORY_CARE_PROVIDER_SITE_OTHER): Payer: Federal, State, Local not specified - PPO | Admitting: Psychology

## 2021-01-07 DIAGNOSIS — F430 Acute stress reaction: Secondary | ICD-10-CM

## 2021-01-21 ENCOUNTER — Ambulatory Visit: Payer: Federal, State, Local not specified - PPO | Admitting: Psychology

## 2021-01-22 DIAGNOSIS — G4733 Obstructive sleep apnea (adult) (pediatric): Secondary | ICD-10-CM | POA: Diagnosis not present

## 2021-01-25 ENCOUNTER — Ambulatory Visit (INDEPENDENT_AMBULATORY_CARE_PROVIDER_SITE_OTHER): Payer: Federal, State, Local not specified - PPO | Admitting: Psychology

## 2021-01-25 DIAGNOSIS — F430 Acute stress reaction: Secondary | ICD-10-CM

## 2021-01-31 ENCOUNTER — Ambulatory Visit: Payer: Federal, State, Local not specified - PPO | Admitting: Psychology

## 2021-02-11 ENCOUNTER — Ambulatory Visit (INDEPENDENT_AMBULATORY_CARE_PROVIDER_SITE_OTHER): Payer: Federal, State, Local not specified - PPO | Admitting: Psychology

## 2021-02-11 DIAGNOSIS — F321 Major depressive disorder, single episode, moderate: Secondary | ICD-10-CM | POA: Diagnosis not present

## 2021-02-11 DIAGNOSIS — F4322 Adjustment disorder with anxiety: Secondary | ICD-10-CM | POA: Diagnosis not present

## 2021-02-11 DIAGNOSIS — G4701 Insomnia due to medical condition: Secondary | ICD-10-CM | POA: Diagnosis not present

## 2021-02-11 DIAGNOSIS — F430 Acute stress reaction: Secondary | ICD-10-CM | POA: Diagnosis not present

## 2021-02-11 DIAGNOSIS — F4311 Post-traumatic stress disorder, acute: Secondary | ICD-10-CM | POA: Diagnosis not present

## 2021-02-25 ENCOUNTER — Ambulatory Visit (INDEPENDENT_AMBULATORY_CARE_PROVIDER_SITE_OTHER): Payer: Federal, State, Local not specified - PPO | Admitting: Psychology

## 2021-02-25 ENCOUNTER — Ambulatory Visit: Payer: Federal, State, Local not specified - PPO | Admitting: Family Medicine

## 2021-02-25 DIAGNOSIS — F430 Acute stress reaction: Secondary | ICD-10-CM

## 2021-04-18 ENCOUNTER — Telehealth: Payer: Self-pay | Admitting: Family Medicine

## 2021-04-18 NOTE — Telephone Encounter (Signed)
Left a message for Stacy Yang to call back and schedule for the next available appointment for dizziness and high blood pressure. 

## 2021-04-18 NOTE — Telephone Encounter (Signed)
Patient informed, Due to the high volume of calls and your symptoms we have to forward your call to our Triage Nurse to expedient your call. Please hold for the transfer.  Patient transferred to [facility]. Due to having high blood pressure for the past two weeks and she has not experienced this since being pregnant.No openings in office or virtual.

## 2021-04-18 NOTE — Telephone Encounter (Signed)
Noted.  Can we get her set up for follow-up with the next available appointment?  Thanks.

## 2021-04-18 NOTE — Telephone Encounter (Signed)
Called to speak with Stacy Yang. Stacy Yang states that she could not follow access nurse advise to go to the Ed for ongoing high blood pressure, light headedness, and dizziness due to being at work. 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 BP read 133/84 inside employee health. Pt declines going to the ED at this time due to lower blood pressure and no feelings of dizziness and headaches. Pt states that she will continue to monitor her BP and if she begins to experience any dizziness or headaches, she will go to be evaluated immediately.

## 2021-04-23 NOTE — Telephone Encounter (Signed)
Left a message for Stacy Yang to call back and schedule for the next available appointment for dizziness and high blood pressure.

## 2021-04-24 NOTE — Telephone Encounter (Signed)
Left a message for Stacy Yang to call back and schedule for the next available appointment for dizziness and high blood pressure. 3rd attempt to contact Stacy Yang by phone. Mychart message has been sent to contact the office and schedule an appointment.

## 2021-04-29 ENCOUNTER — Telehealth: Payer: Self-pay | Admitting: Family Medicine

## 2021-04-29 DIAGNOSIS — Z111 Encounter for screening for respiratory tuberculosis: Secondary | ICD-10-CM

## 2021-04-29 NOTE — Telephone Encounter (Signed)
Patient needing a TB blood test done. She would like the orders placed so that she may come in to the lab for this.   Please advise

## 2021-04-29 NOTE — Telephone Encounter (Signed)
Can you find out what this is for?  Is this is screening for school or her job?  Or is she having symptoms concerning for tuberculosis?

## 2021-04-29 NOTE — Telephone Encounter (Signed)
Ordered.  She can be scheduled for labs.

## 2021-04-29 NOTE — Telephone Encounter (Signed)
Pt states she needs it for work . She usually gets it done at her job but there are so backed up and she wants to make sure she gets it in time.

## 2021-04-29 NOTE — Telephone Encounter (Signed)
Lab appt scheduled.

## 2021-04-30 ENCOUNTER — Other Ambulatory Visit: Payer: Self-pay

## 2021-04-30 ENCOUNTER — Other Ambulatory Visit (INDEPENDENT_AMBULATORY_CARE_PROVIDER_SITE_OTHER): Payer: Federal, State, Local not specified - PPO

## 2021-04-30 DIAGNOSIS — Z111 Encounter for screening for respiratory tuberculosis: Secondary | ICD-10-CM | POA: Diagnosis not present

## 2021-05-03 LAB — QUANTIFERON-TB GOLD PLUS
Mitogen-NIL: >10 IU/mL
NIL: 0.06 IU/mL
QuantiFERON-TB Gold Plus: NEGATIVE
TB1-NIL: 0.01 IU/mL
TB2-NIL: 0.02 IU/mL

## 2021-05-06 DIAGNOSIS — F4322 Adjustment disorder with anxiety: Secondary | ICD-10-CM | POA: Diagnosis not present

## 2021-05-06 DIAGNOSIS — F321 Major depressive disorder, single episode, moderate: Secondary | ICD-10-CM | POA: Diagnosis not present

## 2021-05-06 DIAGNOSIS — G4701 Insomnia due to medical condition: Secondary | ICD-10-CM | POA: Diagnosis not present

## 2021-05-06 DIAGNOSIS — F4311 Post-traumatic stress disorder, acute: Secondary | ICD-10-CM | POA: Diagnosis not present

## 2021-05-22 ENCOUNTER — Other Ambulatory Visit: Payer: Self-pay

## 2021-05-22 ENCOUNTER — Telehealth: Payer: Self-pay | Admitting: Family Medicine

## 2021-05-22 ENCOUNTER — Ambulatory Visit (INDEPENDENT_AMBULATORY_CARE_PROVIDER_SITE_OTHER): Payer: Federal, State, Local not specified - PPO | Admitting: Family Medicine

## 2021-05-22 VITALS — BP 130/70 | HR 91 | Temp 99.0°F | Ht 60.0 in | Wt 192.2 lb

## 2021-05-22 DIAGNOSIS — F4310 Post-traumatic stress disorder, unspecified: Secondary | ICD-10-CM

## 2021-05-22 DIAGNOSIS — R030 Elevated blood-pressure reading, without diagnosis of hypertension: Secondary | ICD-10-CM | POA: Diagnosis not present

## 2021-05-22 DIAGNOSIS — R079 Chest pain, unspecified: Secondary | ICD-10-CM

## 2021-05-22 DIAGNOSIS — Z23 Encounter for immunization: Secondary | ICD-10-CM

## 2021-05-22 NOTE — Assessment & Plan Note (Signed)
This is an ongoing issue.  She will continue to see her psychiatrist.  She will continue to work on seeing a Therapist, nutritional.  Certainly this issue could be triggering the blood pressure issues and could also account for the chest discomfort that she describes.  She will continue her Effexor and discuss this medication further with her psychiatrist.

## 2021-05-22 NOTE — Progress Notes (Signed)
Tommi Rumps, MD Phone: [phone]  Stacy Yang is a 40 y.o. female who presents today for f/u.  Elevated BP Home BP Monitoring not checking recently, notes it was high most of august as she had a stressful month, it was in the 160/100 range on one occasion Chest pain- yes, notes left sided discomfort back in august that radiated to her axilla    Dyspnea- yes, associated with chest pain. Notes CP and SOB occurred while at rest. There was no exertional component to it. She has had no recurrence since august. Has not be check her BP recently. Feels like this coincided with worsening panic attacks.  No reported family history of MI. BMET    Component Value Date/Time   NA 138 08/15/2020 1117   NA 142 04/01/2014 0131   K 4.0 08/15/2020 1117   K 3.8 04/01/2014 0131   CL 103 08/15/2020 1117   CL 105 04/01/2014 0131   CO2 27 08/15/2020 1117   CO2 28 04/01/2014 0131   GLUCOSE 107 (H) 08/15/2020 1117   GLUCOSE 111 (H) 04/01/2014 0131   BUN 16 08/15/2020 1117   BUN 15 04/01/2014 0131   CREATININE 0.95 08/15/2020 1117   CREATININE 0.95 04/01/2014 0131   CALCIUM 9.0 08/15/2020 1117   CALCIUM 8.5 04/01/2014 0131   GFRNONAA >60 08/25/2019 1546   GFRNONAA >60 04/01/2014 0131   GFRAA >60 08/25/2019 1546   GFRAA >60 04/01/2014 0131   PTSD: Patient continues on Effexor.  She continues to see psychiatry.  She wants to see if her psychiatrist will taper her off of the Effexor.  She does not like being on medication.  She continues to have panic attacks and feels as though she cannot swallow when these occur.  She typically tries to do something to take her mind off of things and that seems to help.  She notes the Effexor has not help with the nightmares or the triggers.  She is working on getting a Company secretary.  She has been back to work and is able to do stuff that does not include blood.  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.  Social History   Tobacco Use  Smoking Status Never   Smokeless Tobacco Never    Current Outpatient Medications on File Prior to Visit  Medication Sig Dispense Refill   albuterol (PROVENTIL HFA;VENTOLIN HFA) 108 (90 Base) MCG/ACT inhaler Inhale 2 puffs into the lungs every 6 (six) hours as needed for wheezing or shortness of breath. 2 Inhaler 2   Fluticasone-Salmeterol (AIRDUO RESPICLICK 379/02) 409-73 MCG/ACT AEPB Inhale 1 puff into the lungs 2 (two) times daily. 1 each [place] Take by mouth as needed.     venlafaxine XR (EFFEXOR-XR) 75 MG 24 hr capsule Take 75 mg by mouth daily.     No current facility-administered medications on file prior to visit.     ROS see history of present illness  Objective  Physical Exam Vitals:   05/22/21 1523 05/22/21 1607  BP: 140/80 130/70  Pulse: 91   Temp: 99 F (37.2 C)   SpO2: 99%     BP Readings from Last 3 Encounters:  05/22/21 130/70  11/19/20 120/70  10/04/20 100/70   Wt Readings from Last 3 Encounters:  05/22/21 192 lb 3.2 oz (87.2 kg)  11/19/20 196 lb 3.2 oz (89 kg)  10/04/20 190 lb 6.4 oz (86.4 kg)    Physical Exam Constitutional:      General:  She is not in acute distress.    Appearance: She is not diaphoretic.  Cardiovascular:     Rate and Rhythm: Normal rate and regular rhythm.     Heart sounds: Normal heart sounds.  Pulmonary:     Effort: Pulmonary effort is normal.     Breath sounds: Normal breath sounds.  Skin:    General: Skin is warm and dry.  Neurological:     Mental Status: She is alert.   EKG: Normal sinus rhythm, rate 82, no ST or T wave changes  Assessment/Plan: Please see individual problem list.  Problem List Items Addressed This Visit     Chest pain    The patient has some atypical features of her chest pain.  The pain was occurring at rest and often occurred in the setting of significant panic.  The location of the chest discomfort was typical though otherwise depression is reassuring for a noncardiac cause.  She has no  exertional chest pain.  Her EKG is reassuring.  I suspect this is related to her panic issues.  If she has any recurrence of this she will get looked at right away.      Relevant Orders   EKG 12-Lead (Completed)   Elevated blood pressure reading    Likely this is occurring in the setting of panic and increased anxiety.  She will start to check her blood pressure at home.  We will see her back in 3 months.  We will check lab work today to ensure no other cause for her elevated blood pressure.      Relevant Orders   CBC   Comp Met (CMET)   Lipid panel   HgB A1c   TSH   PTSD (post-traumatic stress disorder)    This is an ongoing issue.  She will continue to see her psychiatrist.  She will continue to work on seeing a Company secretary.  Certainly this issue could be triggering the blood pressure issues and could also account for the chest discomfort that she describes.  She will continue her Effexor and discuss this medication further with her psychiatrist.       Return in about 3 months (around 08/22/2021).  This visit occurred during the SARS-CoV-2 public health emergency.  Safety protocols were in place, including screening questions prior to the visit, additional usage of staff PPE, and extensive cleaning of exam room while observing appropriate contact time as indicated for disinfecting solutions.   I have spent 30 minutes in the care of this patient regarding history taking, documentation, discussion of plan, interpretation of EKG, placing orders.   Tommi Rumps, MD [place]

## 2021-05-22 NOTE — Assessment & Plan Note (Addendum)
The patient has some atypical features of her chest pain.  The pain was occurring at rest and often occurred in the setting of significant panic.  The location of the chest discomfort was typical though otherwise depression is reassuring for a noncardiac cause.  She has no exertional chest pain.  Her EKG is reassuring.  I suspect this is related to her panic issues.  If she has any recurrence of this she will get looked at right away.

## 2021-05-22 NOTE — Patient Instructions (Addendum)
Nice to see you. Please start checking your blood pressure at home.  A good goal blood pressure is less than 130/80.  If it starts to run above that consistently please let me know. If you have any recurrence of chest pain you should be evaluated immediately.  I do suspect the discomfort you are having was related to more of your panic issues. We will get some lab work today. Please follow-up with your psychiatrist as well.

## 2021-05-22 NOTE — Telephone Encounter (Signed)
Patient states she will be out of town next week. States that if the [facility] paperwork is completed this week she can come to pick this up.   States that if the [facility] paperwork is completed next week this will need to be mailed to her with a copy sent to her on mychart as well. This way she can download the forms and e-mail them to her job.

## 2021-05-22 NOTE — Telephone Encounter (Signed)
Patient states she will be out of town next week. States that if the [facility] paperwork is completed this week she can come to pick this up.    States that if the [facility] paperwork is completed next week this will need to be mailed to her with a copy sent to her on mychart as well. This way she can download the forms and e-mail them to her job.  Brittin Belnap,cma

## 2021-05-22 NOTE — Assessment & Plan Note (Addendum)
Likely this is occurring in the setting of panic and increased anxiety.  She will start to check her blood pressure at home.  We will see her back in 3 months.  We will check lab work today to ensure no other cause for her elevated blood pressure.

## 2021-05-23 LAB — CBC
HCT: 39.9 % (ref 36.0–46.0)
Hemoglobin: 13.1 g/dL (ref 12.0–15.0)
MCHC: 32.9 g/dL (ref 30.0–36.0)
MCV: 87.8 fl (ref 78.0–100.0)
Platelets: 247 10*3/uL (ref 150.0–400.0)
RBC: 4.55 Mil/uL (ref 3.87–5.11)
RDW: 13.4 % (ref 11.5–15.5)
WBC: 10.7 10*3/uL — ABNORMAL HIGH (ref 4.0–10.5)

## 2021-05-23 LAB — LIPID PANEL
Cholesterol: 156 mg/dL (ref 0–200)
HDL: 47.9 mg/dL (ref >39.00)
LDL Cholesterol: 71 mg/dL (ref 0–99)
NonHDL: 107.82
Total CHOL/HDL Ratio: 3
Triglycerides: 183 mg/dL — ABNORMAL HIGH (ref 0.0–149.0)
VLDL: 36.6 mg/dL (ref 0.0–40.0)

## 2021-05-23 LAB — COMPREHENSIVE METABOLIC PANEL
ALT: 14 U/L (ref 0–35)
AST: 16 U/L (ref 0–37)
Albumin: 4 g/dL (ref 3.5–5.2)
Alkaline Phosphatase: 66 U/L (ref 39–117)
BUN: 15 mg/dL (ref 6–23)
CO2: 31 mEq/L (ref 19–32)
Calcium: 9.4 mg/dL (ref 8.4–10.5)
Chloride: 100 mEq/L (ref 96–112)
Creatinine, Ser: 0.95 mg/dL (ref 0.40–1.20)
GFR: 74.31 mL/min (ref >60.00)
Glucose, Bld: 95 mg/dL (ref 70–99)
Potassium: 3.9 mEq/L (ref 3.5–5.1)
Sodium: 137 mEq/L (ref 135–145)
Total Bilirubin: 0.8 mg/dL (ref 0.2–1.2)
Total Protein: 6.4 g/dL (ref 6.0–8.3)

## 2021-05-23 LAB — HEMOGLOBIN A1C: Hgb A1c MFr Bld: 5.6 % (ref 4.6–6.5)

## 2021-05-23 LAB — TSH: TSH: 1.41 u[IU]/mL (ref 0.35–5.50)

## 2021-05-23 NOTE — Telephone Encounter (Signed)
Noted.  Can you confirm with her when she was first out of work in 12-20-2019 following her son's death?  Can you confirm the dates she returned to work after that initial absence from work?  Can you also ask how frequently she has had to call out of work or leave work due to her PTSD/panic attacks?  Once I have this information I can fill out the form.

## 2021-05-23 NOTE — Telephone Encounter (Signed)
I called and spoke with the patient and she stated her initial day out of work was when her son dies on 08-06-19 and she went back to work January 2021.  Patient states the PTSD/Panic attacks only happen when triggered so she may miss a day or two from work but its not every week sometimes she can go a few weeks, but its mainly when triggered like his birthday,holidays, etc, she stated she can't really pin point exactly when.  Stacy Yang,cma

## 2021-05-25 ENCOUNTER — Other Ambulatory Visit: Payer: Self-pay | Admitting: Family Medicine

## 2021-05-25 DIAGNOSIS — D72829 Elevated white blood cell count, unspecified: Secondary | ICD-10-CM

## 2021-05-28 NOTE — Telephone Encounter (Signed)
I called and informed the patient that her form is available for pickup and she understood. Clytee Heinrich,cma

## 2021-05-28 NOTE — Telephone Encounter (Signed)
Completed. Please fill in our information and make available for pick up.

## 2021-06-17 ENCOUNTER — Other Ambulatory Visit: Payer: Federal, State, Local not specified - PPO

## 2021-06-24 ENCOUNTER — Other Ambulatory Visit: Payer: Self-pay

## 2021-06-24 ENCOUNTER — Other Ambulatory Visit (INDEPENDENT_AMBULATORY_CARE_PROVIDER_SITE_OTHER): Payer: Federal, State, Local not specified - PPO

## 2021-06-24 DIAGNOSIS — D72829 Elevated white blood cell count, unspecified: Secondary | ICD-10-CM

## 2021-06-24 LAB — CBC WITH DIFFERENTIAL/PLATELET
Basophils Absolute: 0 10*3/uL (ref 0.0–0.1)
Basophils Relative: 0.1 % (ref 0.0–3.0)
Eosinophils Absolute: 0.1 10*3/uL (ref 0.0–0.7)
Eosinophils Relative: 0.9 % (ref 0.0–5.0)
HCT: 38.2 % (ref 36.0–46.0)
Hemoglobin: 12.8 g/dL (ref 12.0–15.0)
Lymphocytes Relative: 29.3 % (ref 12.0–46.0)
Lymphs Abs: 2.5 10*3/uL (ref 0.7–4.0)
MCHC: 33.5 g/dL (ref 30.0–36.0)
MCV: 86.9 fl (ref 78.0–100.0)
Monocytes Absolute: 0.7 10*3/uL (ref 0.1–1.0)
Monocytes Relative: 8 % (ref 3.0–12.0)
Neutro Abs: 5.3 10*3/uL (ref 1.4–7.7)
Neutrophils Relative %: 61.7 % (ref 43.0–77.0)
Platelets: 221 10*3/uL (ref 150.0–400.0)
RBC: 4.39 Mil/uL (ref 3.87–5.11)
RDW: 13.8 % (ref 11.5–15.5)
WBC: 8.6 10*3/uL (ref 4.0–10.5)

## 2021-06-27 DIAGNOSIS — F321 Major depressive disorder, single episode, moderate: Secondary | ICD-10-CM | POA: Diagnosis not present

## 2021-07-22 DIAGNOSIS — J040 Acute laryngitis: Secondary | ICD-10-CM | POA: Diagnosis not present

## 2021-07-22 DIAGNOSIS — H109 Unspecified conjunctivitis: Secondary | ICD-10-CM | POA: Diagnosis not present

## 2021-07-22 DIAGNOSIS — R053 Chronic cough: Secondary | ICD-10-CM | POA: Diagnosis not present

## 2021-07-23 ENCOUNTER — Telehealth: Payer: Self-pay | Admitting: Family Medicine
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
## 2021-07-23 NOTE — Telephone Encounter (Signed)
I received a request for compression stockings from [employer] for this patient.  Can you check with her to see if she actually requested these?  Thanks.

## 2021-07-25 NOTE — Telephone Encounter (Signed)
I called and spoke with the patient and asked if she had requested the compression stockings and she stated Yes she did, that the company [employer] with her insurance will give her a free pair of stockings and shoes and she needs it.  Kyara Boxer,cma

## 2021-07-26 NOTE — Telephone Encounter (Signed)
Noted. Can you also find out what issues she is having with her legs that require compression stockings? We need this documented in the chart for me to sign off on this for her. Thanks.

## 2021-07-30 NOTE — Telephone Encounter (Signed)
LVM for patient o call back for questions concerning the compression stockings.  Denijah Karrer,cma

## 2021-08-02 DIAGNOSIS — G4701 Insomnia due to medical condition: Secondary | ICD-10-CM | POA: Diagnosis not present

## 2021-08-02 DIAGNOSIS — F4322 Adjustment disorder with anxiety: Secondary | ICD-10-CM | POA: Diagnosis not present

## 2021-08-02 DIAGNOSIS — F321 Major depressive disorder, single episode, moderate: Secondary | ICD-10-CM | POA: Diagnosis not present

## 2021-08-02 DIAGNOSIS — F4311 Post-traumatic stress disorder, acute: Secondary | ICD-10-CM | POA: Diagnosis not present

## 2021-08-06 NOTE — Telephone Encounter (Signed)
LVM and sent a mychart message to patient.  Stacy Yang,cma

## 2021-08-19 DIAGNOSIS — F4311 Post-traumatic stress disorder, acute: Secondary | ICD-10-CM | POA: Diagnosis not present

## 2021-08-19 DIAGNOSIS — F4322 Adjustment disorder with anxiety: Secondary | ICD-10-CM | POA: Diagnosis not present

## 2021-08-19 DIAGNOSIS — G4701 Insomnia due to medical condition: Secondary | ICD-10-CM | POA: Diagnosis not present

## 2021-08-19 DIAGNOSIS — F321 Major depressive disorder, single episode, moderate: Secondary | ICD-10-CM | POA: Diagnosis not present

## 2021-08-22 ENCOUNTER — Encounter: Payer: Self-pay | Admitting: Emergency Medicine

## 2021-08-22 ENCOUNTER — Ambulatory Visit
Admission: EM | Admit: 2021-08-22 | Discharge: 2021-08-22 | Disposition: A | Discharge status: Home / Self Care | Payer: Federal, State, Local not specified - PPO | Attending: Emergency Medicine | Admitting: Emergency Medicine

## 2021-08-22 DIAGNOSIS — R051 Acute cough: Secondary | ICD-10-CM

## 2021-08-22 DIAGNOSIS — J0100 Acute maxillary sinusitis, unspecified: Secondary | ICD-10-CM

## 2021-08-22 MED ORDER — ALBUTEROL SULFATE HFA 108 (90 BASE) MCG/ACT IN AERS: 1.0000 | INHALATION_SPRAY | Freq: Four times a day (QID) | RESPIRATORY_TRACT | 0 refills | Status: DC | PRN

## 2021-08-22 MED ORDER — AMOXICILLIN 875 MG PO TABS: 875.0000 mg | ORAL_TABLET | Freq: Two times a day (BID) | ORAL | 0 refills | Status: AC

## 2021-08-22 NOTE — ED Triage Notes (Signed)
Pt here with persistent cough since having the flu in late November. She has tried OTC remedies with no relief and had an E-visit with no relief.
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
## 2021-08-22 NOTE — Discharge Instructions (Addendum)
Use the albuterol inhaler and take the amoxicillin as directed.  Follow up with your primary care provider if your symptoms are not improving.

## 2021-08-29 DIAGNOSIS — F4322 Adjustment disorder with anxiety: Secondary | ICD-10-CM | POA: Diagnosis not present

## 2021-09-10 DIAGNOSIS — F4322 Adjustment disorder with anxiety: Secondary | ICD-10-CM | POA: Diagnosis not present

## 2021-09-10 DIAGNOSIS — F4311 Post-traumatic stress disorder, acute: Secondary | ICD-10-CM | POA: Diagnosis not present

## 2021-09-10 DIAGNOSIS — F321 Major depressive disorder, single episode, moderate: Secondary | ICD-10-CM | POA: Diagnosis not present

## 2021-09-10 DIAGNOSIS — G4701 Insomnia due to medical condition: Secondary | ICD-10-CM | POA: Diagnosis not present

## 2021-09-23 ENCOUNTER — Ambulatory Visit: Payer: Federal, State, Local not specified - PPO | Admitting: Family Medicine

## 2021-09-24 DIAGNOSIS — F4322 Adjustment disorder with anxiety: Secondary | ICD-10-CM | POA: Diagnosis not present

## 2021-10-08 DIAGNOSIS — F4322 Adjustment disorder with anxiety: Secondary | ICD-10-CM | POA: Diagnosis not present

## 2021-11-13 ENCOUNTER — Ambulatory Visit: Payer: Federal, State, Local not specified - PPO | Admitting: Family Medicine

## 2021-12-02 DIAGNOSIS — F4322 Adjustment disorder with anxiety: Secondary | ICD-10-CM | POA: Diagnosis not present

## 2021-12-12 DIAGNOSIS — R0981 Nasal congestion: Secondary | ICD-10-CM | POA: Diagnosis not present

## 2021-12-12 DIAGNOSIS — R059 Cough, unspecified: Secondary | ICD-10-CM | POA: Diagnosis not present

## 2022-01-14 DIAGNOSIS — F4322 Adjustment disorder with anxiety: Secondary | ICD-10-CM | POA: Diagnosis not present
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
## 2022-03-18 ENCOUNTER — Ambulatory Visit: Payer: Federal, State, Local not specified - PPO | Admitting: Nurse Practitioner

## 2022-03-18 ENCOUNTER — Encounter: Payer: Self-pay | Admitting: Nurse Practitioner

## 2022-03-18 VITALS — BP 125/84 | HR 68 | Temp 98.8°F | Ht 64.96 in | Wt 182.7 lb

## 2022-03-18 DIAGNOSIS — M722 Plantar fascial fibromatosis: Secondary | ICD-10-CM

## 2022-03-18 DIAGNOSIS — Z7689 Persons encountering health services in other specified circumstances: Secondary | ICD-10-CM | POA: Diagnosis not present

## 2022-03-18 DIAGNOSIS — F32A Depression, unspecified: Secondary | ICD-10-CM

## 2022-03-18 DIAGNOSIS — F419 Anxiety disorder, unspecified: Secondary | ICD-10-CM | POA: Diagnosis not present

## 2022-03-18 DIAGNOSIS — F339 Major depressive disorder, recurrent, unspecified: Secondary | ICD-10-CM | POA: Diagnosis not present

## 2022-03-18 NOTE — Assessment & Plan Note (Signed)
Chronic. 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  Has tried medications in the past and did not like the way they made her feel.  Exercises and uses other outlets to help control her mood.  Referral placed for psychiatry.  Follow up in 1 month for reevaluation.  Call sooner if concerns arise.

## 2022-03-24 ENCOUNTER — Ambulatory Visit: Payer: Self-pay | Admitting: Nurse Practitioner

## 2022-03-31 DIAGNOSIS — F4322 Adjustment disorder with anxiety: Secondary | ICD-10-CM | POA: Diagnosis not present

## 2022-04-17 ENCOUNTER — Other Ambulatory Visit (HOSPITAL_COMMUNITY)
Admission: RE | Admit: 2022-04-17 | Discharge: 2022-04-17 | Disposition: A | Discharge status: Home / Self Care | Payer: Federal, State, Local not specified - PPO | Source: Ambulatory Visit | Attending: Nurse Practitioner | Admitting: Nurse Practitioner

## 2022-04-17 DIAGNOSIS — Z0000 Encounter for general adult medical examination without abnormal findings: Secondary | ICD-10-CM | POA: Insufficient documentation

## 2022-04-17 NOTE — Progress Notes (Signed)
BP 118/72   Pulse (!) 57   Temp 98.6 F (37 C) (Oral)   Ht 5' (1.524 m)   Wt 177 lb 9.6 oz (80.6 kg)   LMP 04/14/2022 (Approximate)   SpO2 99%   BMI 34.69 kg/m    Subjective:    Patient ID: Stacy Yang, female    DOB: 1981/02/15, 41 y.o.   MRN: [id number]  HPI: Stacy Yang is a 41 y.o. female presenting on 04/22/2022 for comprehensive medical examination. Current medical complaints include:none  She currently lives with: Menopausal Symptoms: no  MOOD Has been a little bit sad in the last couple of days due to her son's upcoming birthday.  That always brings up memories for her.  She continues to see therapist.  Has new psychiatry appt for October.   Depression Screen done today and results listed below:     04/22/2022    9:28 AM 03/18/2022    8:59 AM 07/24/2020    1:17 PM 05/09/2020   10:23 AM 03/05/2020    9:06 AM  Depression screen PHQ 2/9  Decreased Interest 2 1 3  0 0  Down, Depressed, Hopeless 2 1 3  0 0  PHQ - 2 Score 4 2 6  0 0  Altered sleeping 2 1 3  0   Tired, decreased energy 2 3 3 1    Change in appetite 1 0 3 0   Feeling bad or failure about yourself  2 1 3  0   Trouble concentrating 1 0 3 0   Moving slowly or fidgety/restless 0 0 3 0   Suicidal thoughts 0 0 0 0   PHQ-9 Score 12 7 24 1    Difficult doing work/chores Somewhat difficult Somewhat difficult Very difficult Somewhat difficult     The patient does not have a history of falls. I did complete a risk assessment for falls. A plan of care for falls was documented.   Past Medical History:  Past Medical History:  Diagnosis Date   Acne    Asthma    Cardiomyopathy (HCC)    Family history of lung cancer    Family history of pancreatic cancer     Surgical History:  Past Surgical History:  Procedure Laterality Date   CESAREAN SECTION     x3   COLONOSCOPY WITH PROPOFOL N/A 11/07/2019   Procedure: COLONOSCOPY WITH PROPOFOL;  Surgeon: , MD;  Location: [facility];  Service:  Gastroenterology;  Laterality: N/A;   ESOPHAGOGASTRODUODENOSCOPY (EGD) WITH PROPOFOL N/A 07/15/2017   Procedure: ESOPHAGOGASTRODUODENOSCOPY (EGD) WITH PROPOFOL;  Surgeon: , MD;  Location: [facility];  Service: Endoscopy;  Laterality: N/A;   TUBAL LIGATION      Medications:  Current Outpatient Medications on File Prior to Visit  Medication Sig   Fluticasone-Salmeterol (AIRDUO RESPICLICK 113/14) 113-14 MCG/ACT AEPB Inhale 1 puff into the lungs 2 (two) times daily.   No current facility-administered medications on file prior to visit.    Allergies:  Allergies  Allergen Reactions   Elemental Sulfur Hives   Iodinated Contrast Media Hives   Iodine Swelling    Social History:  Social History   Socioeconomic History   Marital status: Divorced    Spouse name: Not on file   Number of children: Not on file   Years of education: Not on file   Highest education level: Not on file  Occupational History   Not on file  Tobacco Use   Smoking status: Never   Smokeless tobacco: Never  Vaping Use   Vaping Use: Never used  Substance and Sexual Activity   Alcohol use: No   Drug use: No   Sexual activity: Yes  Other Topics Concern   Not on file  Social History Narrative   Dental asst at the [employer]   Daughters    Son died 23-Jul-2020 GSW   1 grandaughter 2 y.o as of 07/24/20    Social Determinants of Health   Financial Resource Strain: Not on file  Food Insecurity: Not on file  Transportation Needs: Not on file  Physical Activity: Not on file  Stress: Not on file  Social Connections: Not on file  Intimate Partner Violence: Not on file   Social History   Tobacco Use  Smoking Status Never  Smokeless Tobacco Never   Social History   Substance and Sexual Activity  Alcohol Use No    Family History:  Family History  Problem Relation Age of Onset   Kidney disease Mother    Alcohol abuse Father    Lung cancer Father    Diabetes Maternal Aunt     Diabetes Maternal Grandmother    Cirrhosis Paternal Grandfather    Pancreatic cancer Paternal Grandfather    Pancreatic cancer Paternal Uncle    Dementia Paternal Grandmother     Past medical history, surgical history, medications, allergies, family history and social history reviewed with patient today and changes made to appropriate areas of the chart.   Review of Systems  Psychiatric/Behavioral:  Positive for depression. Negative for suicidal ideas. The patient is nervous/anxious.    All other ROS negative except what is listed above and in the HPI.      Objective:    BP 118/72   Pulse (!) 57   Temp 98.6 F (37 C) (Oral)   Ht 5' (1.524 m)   Wt 177 lb 9.6 oz (80.6 kg)   LMP 04/14/2022 (Approximate)   SpO2 99%   BMI 34.69 kg/m   Wt Readings from Last 3 Encounters:  04/22/22 177 lb 9.6 oz (80.6 kg)  03/18/22 182 lb 11.2 oz (82.9 kg)  05/22/21 192 lb 3.2 oz (87.2 kg)    Physical Exam Vitals and nursing note reviewed. Exam conducted with a chaperone present [facility] [place], [place]).  Constitutional:      General: She is awake. She is not in acute distress.    Appearance: Normal appearance. She is well-developed. She is obese. She is not ill-appearing.  HENT:     Head: Normocephalic and atraumatic.     Right Ear: Hearing, tympanic membrane, ear canal and external ear normal. No drainage.     Left Ear: Hearing, tympanic membrane, ear canal and external ear normal. No drainage.     Nose: Nose normal.     Right Sinus: No maxillary sinus tenderness or frontal sinus tenderness.     Left Sinus: No maxillary sinus tenderness or frontal sinus tenderness.     Mouth/Throat:     Mouth: Mucous membranes are moist.     Pharynx: Oropharynx is clear. Uvula midline. No pharyngeal swelling, oropharyngeal exudate or posterior oropharyngeal erythema.  Eyes:     General: Lids are normal.        Right eye: No discharge.        Left eye: No discharge.     Extraocular Movements: Extraocular  movements intact.     Conjunctiva/sclera: Conjunctivae normal.     Pupils: Pupils are equal, round, and reactive to light.     Visual Fields: Right  eye visual fields normal and left eye visual fields normal.  Neck:     Thyroid: No thyromegaly.     Vascular: No carotid bruit.     Trachea: Trachea normal.  Cardiovascular:     Rate and Rhythm: Normal rate and regular rhythm.     Heart sounds: Normal heart sounds. No murmur heard.    No gallop.  Pulmonary:     Effort: Pulmonary effort is normal. No accessory muscle usage or respiratory distress.     Breath sounds: Normal breath sounds.  Chest:  Breasts:    Right: Normal.     Left: Normal.  Abdominal:     General: Bowel sounds are normal.     Palpations: Abdomen is soft. There is no hepatomegaly or splenomegaly.     Tenderness: There is no abdominal tenderness.  Genitourinary:    Vagina: Normal.     Cervix: Normal.     Adnexa: Right adnexa normal and left adnexa normal.  Musculoskeletal:        General: Normal range of motion.     Cervical back: Normal range of motion and neck supple.     Right lower leg: No edema.     Left lower leg: No edema.  Lymphadenopathy:     Head:     Right side of head: No submental, submandibular, tonsillar, preauricular or posterior auricular adenopathy.     Left side of head: No submental, submandibular, tonsillar, preauricular or posterior auricular adenopathy.     Cervical: No cervical adenopathy.     Upper Body:     Right upper body: No supraclavicular, axillary or pectoral adenopathy.     Left upper body: No supraclavicular, axillary or pectoral adenopathy.  Skin:    General: Skin is warm and dry.     Capillary Refill: Capillary refill takes less than 2 seconds.     Findings: No rash.  Neurological:     Mental Status: She is alert and oriented to person, place, and time.     Gait: Gait is intact.     Deep Tendon Reflexes: Reflexes are normal and symmetric.     Reflex Scores:       Brachioradialis reflexes are 2+ on the right side and 2+ on the left side.      Patellar reflexes are 2+ on the right side and 2+ on the left side. Psychiatric:        Attention and Perception: Attention normal.        Mood and Affect: Mood normal.        Speech: Speech normal.        Behavior: Behavior normal. Behavior is cooperative.        Thought Content: Thought content normal.        Judgment: Judgment normal.     Results for orders placed or performed in visit on 06/24/21  CBC with Differential/Platelet  Result Value Ref Range   WBC 8.6 4.0 - 10.5 K/uL   RBC 4.39 3.87 - 5.11 Mil/uL   Hemoglobin 12.8 12.0 - 15.0 g/dL   HCT 95.0 93.2 - 67.1 %   MCV 86.9 78.0 - 100.0 fl   MCHC 33.5 30.0 - 36.0 g/dL   RDW 24.5 80.9 - 98.3 %   Platelets 221.0 150.0 - 400.0 K/uL   Neutrophils Relative % 61.7 43.0 - 77.0 %   Lymphocytes Relative 29.3 12.0 - 46.0 %   Monocytes Relative 8.0 3.0 - 12.0 %   Eosinophils Relative 0.9 0.0 - 5.0 %  Basophils Relative 0.1 0.0 - 3.0 %   Neutro Abs 5.3 1.4 - 7.7 K/uL   Lymphs Abs 2.5 0.7 - 4.0 K/uL   Monocytes Absolute 0.7 0.1 - 1.0 K/uL   Eosinophils Absolute 0.1 0.0 - 0.7 K/uL   Basophils Absolute 0.0 0.0 - 0.1 K/uL      Assessment & Plan:   Problem List Items Addressed This Visit       Other   Anxiety and depression    Chronic. Controlled.  See's psychiatry and has a therapist.  Has a new psychiatry appt in October.  Continue to follow their recommendations.       Depression, recurrent (HCC)    Chronic. Controlled.  See's psychiatry and has a therapist.  Has a new psychiatry appt in October.  Continue to follow their recommendations.       Other Visit Diagnoses     Annual physical exam    -  Primary   Health maintenance reviewed during visit today. Labs ordered.  PAP done.  Mammogram ordered and scheduled.     Relevant Orders   CBC with Differential/Platelet   Comprehensive metabolic panel   Lipid panel   TSH   Urinalysis, Routine  w reflex microscopic   Cytology - PAP   Screening for ischemic heart disease       Relevant Orders   Lipid panel   Screening for HIV (human immunodeficiency virus)       Relevant Orders   HIV Antibody (routine testing w rflx)   Encounter for hepatitis C screening test for low risk patient       Relevant Orders   Hepatitis C Antibody   Encounter for screening mammogram for malignant neoplasm of breast       Relevant Orders   MM 3D SCREEN BREAST BILATERAL        Follow up plan: Return in about 6 months (around 10/21/2022) for Depression/Anxiety FU.   LABORATORY TESTING:  - Pap smear: pap done  IMMUNIZATIONS:   - Tdap: Tetanus vaccination status reviewed: last tetanus booster within 10 years. - Influenza:  Receives flu shot at work - Pneumovax: Not applicable - Prevnar: Not applicable - COVID: Not applicable - HPV: Not applicable - Shingrix vaccine: Not applicable  SCREENING: -Mammogram: Ordered today  - Colonoscopy: Not applicable  - Bone Density: Not applicable  -Hearing Test: Not applicable  -Spirometry: Not applicable   PATIENT COUNSELING:   Advised to take 1 mg of folate supplement per day if capable of pregnancy.   Sexuality: Discussed sexually transmitted diseases, partner selection, use of condoms, avoidance of unintended pregnancy  and contraceptive alternatives.   Advised to avoid cigarette smoking.  I discussed with the patient that most people either abstain from alcohol or drink within safe limits (<=14/week and <=4 drinks/occasion for males, <=7/weeks and <= 3 drinks/occasion for females) and that the risk for alcohol disorders and other health effects rises proportionally with the number of drinks per week and how often a drinker exceeds daily limits.  Discussed cessation/primary prevention of drug use and availability of treatment for abuse.   Diet: Encouraged to adjust caloric intake to maintain  or achieve ideal body weight, to reduce intake of dietary  saturated fat and total fat, to limit sodium intake by avoiding high sodium foods and not adding table salt, and to maintain adequate dietary potassium and calcium preferably from fresh fruits, vegetables, and low-fat dairy products.    stressed the importance of regular exercise  Injury prevention:  Discussed safety belts, safety helmets, smoke detector, smoking near bedding or upholstery.   Dental health: Discussed importance of regular tooth brushing, flossing, and dental visits.    NEXT PREVENTATIVE PHYSICAL DUE IN 1 YEAR. Return in about 6 months (around 10/21/2022) for Depression/Anxiety FU.

## 2022-04-22 ENCOUNTER — Ambulatory Visit (INDEPENDENT_AMBULATORY_CARE_PROVIDER_SITE_OTHER): Payer: Federal, State, Local not specified - PPO | Admitting: Nurse Practitioner

## 2022-04-22 ENCOUNTER — Encounter: Payer: Self-pay | Admitting: Nurse Practitioner

## 2022-04-22 VITALS — BP 118/72 | HR 57 | Temp 98.6°F | Ht 60.0 in | Wt 177.6 lb

## 2022-04-22 DIAGNOSIS — Z136 Encounter for screening for cardiovascular disorders: Secondary | ICD-10-CM

## 2022-04-22 DIAGNOSIS — Z114 Encounter for screening for human immunodeficiency virus [HIV]: Secondary | ICD-10-CM | POA: Diagnosis not present

## 2022-04-22 DIAGNOSIS — Z0000 Encounter for general adult medical examination without abnormal findings: Secondary | ICD-10-CM | POA: Diagnosis not present

## 2022-04-22 DIAGNOSIS — Z1159 Encounter for screening for other viral diseases: Secondary | ICD-10-CM

## 2022-04-22 DIAGNOSIS — F419 Anxiety disorder, unspecified: Secondary | ICD-10-CM | POA: Diagnosis not present

## 2022-04-22 DIAGNOSIS — Z1231 Encounter for screening mammogram for malignant neoplasm of breast: Secondary | ICD-10-CM

## 2022-04-22 DIAGNOSIS — F339 Major depressive disorder, recurrent, unspecified: Secondary | ICD-10-CM

## 2022-04-22 DIAGNOSIS — F4322 Adjustment disorder with anxiety: Secondary | ICD-10-CM | POA: Diagnosis not present

## 2022-04-22 DIAGNOSIS — F32A Depression, unspecified: Secondary | ICD-10-CM

## 2022-04-22 LAB — URINALYSIS, ROUTINE W REFLEX MICROSCOPIC
Bilirubin, UA: NEGATIVE
Glucose, UA: NEGATIVE
Ketones, UA: NEGATIVE
Leukocytes,UA: NEGATIVE
Nitrite, UA: NEGATIVE
Protein,UA: NEGATIVE
RBC, UA: NEGATIVE
Specific Gravity, UA: 1.02 (ref 1.005–1.030)
Urobilinogen, Ur: 1 mg/dL (ref 0.2–1.0)
pH, UA: 7 (ref 5.0–7.5)

## 2022-04-22 NOTE — Assessment & Plan Note (Signed)
Chronic. Controlled.  See's psychiatry and has a therapist.  Has a new psychiatry appt in October.  Continue to follow their recommendations.

## 2022-04-22 NOTE — Assessment & Plan Note (Signed)
Chronic. Controlled.  See's psychiatry and has a therapist.  Has a new psychiatry appt in October.  Continue to follow their recommendations.  

## 2022-04-23 LAB — COMPREHENSIVE METABOLIC PANEL
ALT: 13 IU/L (ref 0–32)
AST: 15 IU/L (ref 0–40)
Albumin/Globulin Ratio: 1.6 (ref 1.2–2.2)
Albumin: 4.1 g/dL (ref 3.9–4.9)
Alkaline Phosphatase: 65 IU/L (ref 44–121)
BUN/Creatinine Ratio: 24 — ABNORMAL HIGH (ref 9–23)
BUN: 19 mg/dL (ref 6–24)
Bilirubin Total: 0.7 mg/dL (ref 0.0–1.2)
CO2: 23 mmol/L (ref 20–29)
Calcium: 9.2 mg/dL (ref 8.7–10.2)
Chloride: 103 mmol/L (ref 96–106)
Creatinine, Ser: 0.8 mg/dL (ref 0.57–1.00)
Globulin, Total: 2.6 g/dL (ref 1.5–4.5)
Glucose: 91 mg/dL (ref 70–99)
Potassium: 4.2 mmol/L (ref 3.5–5.2)
Sodium: 140 mmol/L (ref 134–144)
Total Protein: 6.7 g/dL (ref 6.0–8.5)
eGFR: 94 mL/min/{1.73_m2} (ref >59)

## 2022-04-23 LAB — LIPID PANEL
Chol/HDL Ratio: 3.9 ratio (ref 0.0–4.4)
Cholesterol, Total: 181 mg/dL (ref 100–199)
HDL: 46 mg/dL (ref >39)
LDL Chol Calc (NIH): 114 mg/dL — ABNORMAL HIGH (ref 0–99)
Triglycerides: 119 mg/dL (ref 0–149)
VLDL Cholesterol Cal: 21 mg/dL (ref 5–40)

## 2022-04-23 LAB — CBC WITH DIFFERENTIAL/PLATELET
Basophils Absolute: 0 10*3/uL (ref 0.0–0.2)
Basos: 0 %
EOS (ABSOLUTE): 0.1 10*3/uL (ref 0.0–0.4)
Eos: 2 %
Hematocrit: 41.7 % (ref 34.0–46.6)
Hemoglobin: 13.6 g/dL (ref 11.1–15.9)
Immature Grans (Abs): 0 10*3/uL (ref 0.0–0.1)
Immature Granulocytes: 0 %
Lymphocytes Absolute: 1.7 10*3/uL (ref 0.7–3.1)
Lymphs: 27 %
MCH: 28.8 pg (ref 26.6–33.0)
MCHC: 32.6 g/dL (ref 31.5–35.7)
MCV: 88 fL (ref 79–97)
Monocytes Absolute: 0.4 10*3/uL (ref 0.1–0.9)
Monocytes: 6 %
Neutrophils Absolute: 3.9 10*3/uL (ref 1.4–7.0)
Neutrophils: 65 %
Platelets: 219 10*3/uL (ref 150–450)
RBC: 4.72 x10E6/uL (ref 3.77–5.28)
RDW: 12.9 % (ref 11.7–15.4)
WBC: 6.1 10*3/uL (ref 3.4–10.8)

## 2022-04-23 LAB — TSH: TSH: 1.27 u[IU]/mL (ref 0.450–4.500)

## 2022-04-23 LAB — HEPATITIS C ANTIBODY: Hep C Virus Ab: NONREACTIVE

## 2022-04-23 LAB — HIV ANTIBODY (ROUTINE TESTING W REFLEX): HIV Screen 4th Generation wRfx: NONREACTIVE

## 2022-04-23 NOTE — Progress Notes (Signed)
Hi Stacy Yang. It was nice to see you yesterday.  Your lab work looks good.  No concerns at this time. Continue with your current medication regimen.  Follow up as discussed.  Please let me know if you have any questions.

## 2022-04-24 LAB — CYTOLOGY - PAP: Diagnosis: NEGATIVE

## 2022-04-24 NOTE — Progress Notes (Signed)
Hi Stacy Yang.  Your PAP was normal.

## 2022-05-19 ENCOUNTER — Ambulatory Visit
Admission: RE | Admit: 2022-05-19 | Discharge: 2022-05-19 | Disposition: A | Discharge status: Home / Self Care | Payer: Federal, State, Local not specified - PPO | Source: Ambulatory Visit | Attending: Nurse Practitioner | Admitting: Nurse Practitioner

## 2022-05-19 ENCOUNTER — Encounter: Payer: Self-pay | Admitting: Nurse Practitioner

## 2022-05-19 ENCOUNTER — Ambulatory Visit: Payer: Federal, State, Local not specified - PPO | Admitting: Nurse Practitioner

## 2022-05-19 VITALS — BP 136/90 | HR 60 | Temp 98.8°F | Wt 177.1 lb

## 2022-05-19 DIAGNOSIS — F32A Depression, unspecified: Secondary | ICD-10-CM

## 2022-05-19 DIAGNOSIS — Z1231 Encounter for screening mammogram for malignant neoplasm of breast: Secondary | ICD-10-CM | POA: Insufficient documentation

## 2022-05-19 DIAGNOSIS — F419 Anxiety disorder, unspecified: Secondary | ICD-10-CM | POA: Diagnosis not present

## 2022-05-19 NOTE — Assessment & Plan Note (Signed)
Chronic.  Ongoing.  Needs FMLA paperwork filled out for work.  Completed during office visit today.  Follow up as scheduled.

## 2022-05-19 NOTE — Progress Notes (Signed)
BP (!) 136/90   Pulse 60   Temp 98.8 F (37.1 C) (Oral)   Wt 177 lb 1.6 oz (80.3 kg)   LMP 04/13/2022 Comment: NCP  SpO2 100%   BMI 34.59 kg/m    Subjective:    Patient ID: Stacy Yang, female    DOB: 1980/08/29, 41 y.o.   MRN: [id number]  HPI: Stacy Yang is a 41 y.o. female  Chief Complaint  Patient presents with   Form Completion    Patient is here for [facility] paperwork.    Hoarse     Patient states she woke up hoarse, she would like her lungs listened to    DEPRESSION/ANXIETY Patient states this week has been a bad week.  Her son's birthday was Friday and that is always a trigger for her.  Patient states she had bad dreams this weekend and they were very realistic.     Relevant past medical, surgical, family and social history reviewed and updated as indicated. Interim medical history since our last visit reviewed. Allergies and medications reviewed and updated.  Review of Systems  Psychiatric/Behavioral:  Positive for dysphoric mood. The patient is nervous/anxious.     Per HPI unless specifically indicated above     Objective:    BP (!) 136/90   Pulse 60   Temp 98.8 F (37.1 C) (Oral)   Wt 177 lb 1.6 oz (80.3 kg)   LMP 04/13/2022 Comment: NCP  SpO2 100%   BMI 34.59 kg/m   Wt Readings from Last 3 Encounters:  05/19/22 177 lb 1.6 oz (80.3 kg)  04/22/22 177 lb 9.6 oz (80.6 kg)  03/18/22 182 lb 11.2 oz (82.9 kg)    Physical Exam Vitals and nursing note reviewed.  Constitutional:      General: She is not in acute distress.    Appearance: Normal appearance. She is normal weight. She is not ill-appearing, toxic-appearing or diaphoretic.  HENT:     Head: Normocephalic.     Right Ear: External ear normal.     Left Ear: External ear normal.     Nose: Nose normal.     Mouth/Throat:     Mouth: Mucous membranes are moist.     Pharynx: Oropharynx is clear.  Eyes:     General:        Right eye: No discharge.        Left eye: No discharge.      Extraocular Movements: Extraocular movements intact.     Conjunctiva/sclera: Conjunctivae normal.     Pupils: Pupils are equal, round, and reactive to light.  Cardiovascular:     Rate and Rhythm: Normal rate and regular rhythm.     Heart sounds: No murmur heard. Pulmonary:     Effort: Pulmonary effort is normal. No respiratory distress.     Breath sounds: Normal breath sounds. No wheezing or rales.  Musculoskeletal:     Cervical back: Normal range of motion and neck supple.  Skin:    General: Skin is warm and dry.     Capillary Refill: Capillary refill takes less than 2 seconds.  Neurological:     General: No focal deficit present.     Mental Status: She is alert and oriented to person, place, and time. Mental status is at baseline.  Psychiatric:        Mood and Affect: Mood normal.        Behavior: Behavior normal.        Thought Content: Thought content normal.  Judgment: Judgment normal.     Results for orders placed or performed in visit on 04/22/22  CBC with Differential/Platelet  Result Value Ref Range   WBC 6.1 3.4 - 10.8 x10E3/uL   RBC 4.72 3.77 - 5.28 x10E6/uL   Hemoglobin 13.6 11.1 - 15.9 g/dL   Hematocrit 41.7 34.0 - 46.6 %   MCV 88 79 - 97 fL   MCH 28.8 26.6 - 33.0 pg   MCHC 32.6 31.5 - 35.7 g/dL   RDW 12.9 11.7 - 15.4 %   Platelets 219 150 - 450 x10E3/uL   Neutrophils 65 Not Estab. %   Lymphs 27 Not Estab. %   Monocytes 6 Not Estab. %   Eos 2 Not Estab. %   Basos 0 Not Estab. %   Neutrophils Absolute 3.9 1.4 - 7.0 x10E3/uL   Lymphocytes Absolute 1.7 0.7 - 3.1 x10E3/uL   Monocytes Absolute 0.4 0.1 - 0.9 x10E3/uL   EOS (ABSOLUTE) 0.1 0.0 - 0.4 x10E3/uL   Basophils Absolute 0.0 0.0 - 0.2 x10E3/uL   Immature Granulocytes 0 Not Estab. %   Immature Grans (Abs) 0.0 0.0 - 0.1 x10E3/uL  Comprehensive metabolic panel  Result Value Ref Range   Glucose 91 70 - 99 mg/dL   BUN 19 6 - 24 mg/dL   Creatinine, Ser 0.80 0.57 - 1.00 mg/dL   eGFR 94 >59 mL/min/1.73    BUN/Creatinine Ratio 24 (H) 9 - 23   Sodium 140 134 - 144 mmol/L   Potassium 4.2 3.5 - 5.2 mmol/L   Chloride 103 96 - 106 mmol/L   CO2 23 20 - 29 mmol/L   Calcium 9.2 8.7 - 10.2 mg/dL   Total Protein 6.7 6.0 - 8.5 g/dL   Albumin 4.1 3.9 - 4.9 g/dL   Globulin, Total 2.6 1.5 - 4.5 g/dL   Albumin/Globulin Ratio 1.6 1.2 - 2.2   Bilirubin Total 0.7 0.0 - 1.2 mg/dL   Alkaline Phosphatase 65 44 - 121 IU/L   AST 15 0 - 40 IU/L   ALT 13 0 - 32 IU/L  Lipid panel  Result Value Ref Range   Cholesterol, Total 181 100 - 199 mg/dL   Triglycerides 119 0 - 149 mg/dL   HDL 46 >39 mg/dL   VLDL Cholesterol Cal 21 5 - 40 mg/dL   LDL Chol Calc (NIH) 114 (H) 0 - 99 mg/dL   Chol/HDL Ratio 3.9 0.0 - 4.4 ratio  TSH  Result Value Ref Range   TSH 1.270 0.450 - 4.500 uIU/mL  Urinalysis, Routine w reflex microscopic  Result Value Ref Range   Specific Gravity, UA 1.020 1.005 - 1.030   pH, UA 7.0 5.0 - 7.5   Color, UA Yellow Yellow   Appearance Ur Clear Clear   Leukocytes,UA Negative Negative   Protein,UA Negative Negative/Trace   Glucose, UA Negative Negative   Ketones, UA Negative Negative   RBC, UA Negative Negative   Bilirubin, UA Negative Negative   Urobilinogen, Ur 1.0 0.2 - 1.0 mg/dL   Nitrite, UA Negative Negative  HIV Antibody (routine testing w rflx)  Result Value Ref Range   HIV Screen 4th Generation wRfx Non Reactive Non Reactive  Hepatitis C Antibody  Result Value Ref Range   Hep C Virus Ab Non Reactive Non Reactive  Cytology - PAP  Result Value Ref Range   Adequacy      Satisfactory for evaluation; transformation zone component PRESENT.   Diagnosis      - Negative for intraepithelial lesion or  malignancy (NILM)      Assessment & Plan:   Problem List Items Addressed This Visit       Other   Anxiety and depression - Primary    Chronic.  Ongoing.  Needs FMLA paperwork filled out for work.  Completed during office visit today.  Follow up as scheduled.         Follow up  plan: No follow-ups on file.

## 2022-05-20 NOTE — Progress Notes (Signed)
Please let patient know her Mammogram did not show any evidence of a malignancy.  The recommendation is to repeat the Mammogram in 1 year.  

## 2022-05-26 DIAGNOSIS — F4322 Adjustment disorder with anxiety: Secondary | ICD-10-CM | POA: Diagnosis not present

## 2022-06-05 ENCOUNTER — Ambulatory Visit: Payer: Federal, State, Local not specified - PPO | Admitting: Psychiatry

## 2022-06-17 DIAGNOSIS — F4322 Adjustment disorder with anxiety: Secondary | ICD-10-CM | POA: Diagnosis not present

## 2022-07-07 DIAGNOSIS — F4322 Adjustment disorder with anxiety: Secondary | ICD-10-CM | POA: Diagnosis not present

## 2022-07-21 DIAGNOSIS — F4322 Adjustment disorder with anxiety: Secondary | ICD-10-CM | POA: Diagnosis not present

## 2022-07-31 ENCOUNTER — Telehealth (INDEPENDENT_AMBULATORY_CARE_PROVIDER_SITE_OTHER): Payer: Federal, State, Local not specified - PPO | Admitting: Physician Assistant

## 2022-07-31 ENCOUNTER — Encounter: Payer: Self-pay | Admitting: Physician Assistant

## 2022-07-31 VITALS — Temp 97.1°F

## 2022-07-31 DIAGNOSIS — J069 Acute upper respiratory infection, unspecified: Secondary | ICD-10-CM

## 2022-07-31 MED ORDER — PREDNISONE 20 MG PO TABS: ORAL_TABLET | ORAL | 0 refills | Status: DC

## 2022-07-31 MED ORDER — BENZONATATE 200 MG PO CAPS: 200.0000 mg | ORAL_CAPSULE | Freq: Two times a day (BID) | ORAL | 0 refills | Status: DC | PRN

## 2022-07-31 NOTE — Progress Notes (Signed)
Virtual Visit via Video Note  I connected with Stacy Yang on 07/31/22 at 11:00 AM EST by a video enabled telemedicine application and verified that I am speaking with the correct person using two identifiers. Today's Provider: Jacquelin Hawking, MHS, PA-C Introduced myself to the patient as a PA-C and provided education on APPs in clinical practice.   Location: Patient: At home [place], [place]  Provider: [facility], [place], [place]    I discussed the limitations of evaluation and management by telemedicine and the availability of in person appointments. The patient expressed understanding and agreed to proceed.   Chief Complaint  Patient presents with   Cough   Nasal Congestion    Patient says she became symptomatic last Thursday. Patient says she has been taking Mucinex DM for the past 3-4 days and using cough drops. Patient says she only takes it at night as it makes her dizzy if she takes it during the day.      History of Present Illness:   URI-Type symptoms Onset: sudden  Duration: one week- since last Thurs  Associated symptoms: productive cough, hoarse voice, headaches  Thinks she is wheezing a bit  Reports peak in symptoms on Mon and Tues States she has never produced this much mucus with previous illnesses  Interventions: Mucinex DM at night- feels like this helps with congestion when she wakes up  Is having some trouble with drinking enough water during illness  COVID testing: has taken 2 tests at home   Results:negative for both  Recent sick contacts: grandchildren tested positive for RSV the week before she started having symptoms  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 Negative for fever.  HENT:  Positive for congestion and sore throat. Negative for ear pain.   Respiratory:  Positive for cough, sputum production and wheezing. Negative for shortness of breath.   Neurological:   Positive for dizziness and headaches.     Observations/Objective:  Due to the nature of the virtual visit, physical exam and observations are limited. Able to obtain the following observations:   Alert, oriented x 3  Appears comfortable, in no acute distress but actively coughing and sniffing  No scleral injection, no appreciated hoarseness, tachypnea, wheeze or strider. Able to maintain conversation without visible strain.   cough appreciated during visit.    Assessment and Plan:  Problem List Items Addressed This Visit   None Visit Diagnoses     Acute URI    -  Primary Acute, new concern Suspect infection with RSV given recent contact with active cases and symptoms  Will send in script for Tessalon perls to assist with coughing along with Prednisone since she admits to some wheezing and persistent coughing Reviewed continued use of OTC Mucinex DM and increasing hydration efforts as tolerated Reviewed RSV course and symptoms Recommend follow  up in office for persistent or progressing symptoms    Relevant Medications   benzonatate (TESSALON) 200 MG capsule   predniSONE (DELTASONE) 20 MG tablet      Follow Up Instructions:    I discussed the assessment and treatment plan with the patient. The patient was provided an opportunity to ask questions and all were answered. The patient agreed with the plan and demonstrated an understanding of the instructions.   The patient was advised to call back or seek an in-person evaluation if the symptoms worsen or if the condition fails to  improve as anticipated.  I provided 16 minutes of non-face-to-face time during this encounter.  No follow-ups on file.   I, Maresa Morash E Antoino Westhoff, PA-C, have reviewed all documentation for this visit. The documentation on 07/31/22 for the exam, diagnosis, procedures, and orders are all accurate and complete.   Jacquelin Hawking, MHS, PA-C [facility]

## 2022-07-31 NOTE — Patient Instructions (Addendum)
  Based on your described symptoms and the duration of symptoms it is likely that you have a viral upper respiratory infection called RSV   The goal of treatment at this time is to reduce your symptoms and discomfort   I have sent in Tessalon pearls for you to take twice per day to help with your cough  You can use sterile nasal rinses to help with flushing your nasal passages You can use a humidifier at night to help with nasal irritation   You can use over the counter medications such as Dayquil/Nyquil, AlkaSeltzer formulations, etc to provide further relief of symptoms according to the manufacturer's instructions   If your symptoms do not improve or become worse in the next 5-7 days please make an apt at the office so we can see you  Go to the ER if you begin to have more serious symptoms such as shortness of breath, trouble breathing, loss of consciousness, swelling around the eyes, high fever, severe lasting headaches, vision changes or neck pain/stiffness.

## 2022-08-07 ENCOUNTER — Ambulatory Visit: Payer: BLUE CROSS/BLUE SHIELD

## 2022-08-20 ENCOUNTER — Ambulatory Visit: Payer: BLUE CROSS/BLUE SHIELD

## 2022-08-20 NOTE — Progress Notes
Patient Consent to Telehealth   The patient agreed to participate in the video visit prior to joining the visit.        Subjective:     HPI:  Melinda Mcguire is a 42 y.o. female [facility] in consultation for Hashimoto's hypothyroidism.  This is my first visit with her.  She is seen with her husband remotely as they live in [place], [place].    She was diagnosed with hypothyroidism during early 30s while living in [place], and has been thyroid hormone replacement since.  She has tried Synthroid but did not tolerate this.  She is currently taking Armour 60 mg daily (increased from 45 mg daily about six months ago).  She confirms that she takes this on an empty stomach and at least 30-60 mins prior to food, drink, meds, or supplements.  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    She has had some pseudonodules found on an ultrasound, that were biopsied to be negative for malignancy.    She has 2 children and has no further plans for pregnancy.    PAST MEDICAL HISTORY:  Past Medical History:   Diagnosis Date    Hashimoto's thyroiditis     Hypothyroidism      PAST SURGICAL HISTORY:  No past surgical history on file.     FAMILY HISTORY:   Family History       Relation Problem Comments    Mother    Rheum arthritis        Sister    Crohn's disease              SOCIAL HISTORY:  Married    MEDICATIONS:   Current Outpatient Medications:     thyroid (ARMOUR THYROID) 60 mg tablet, Take 1 tablet (60 mg total) by mouth daily., Disp: , Rfl:     ALLERGIES:  Not on File    REVIEW OF SYSTEMS:  As per HPI.     Objective:      VITALS:  There were no vitals taken for this visit.     Wt Readings from Last 3 Encounters:   No data found for Wt     PHYSICAL EXAM:  Constitutional: Patient appears well-developed and well-nourished.   Neck: Normal range of motion.   Neurological: Alert.  No tremors.       Review of Data:     I have:   [x]  Reviewed/ordered []  1 [x]  2 []  ? 3 unique laboratory, radiology, and/or diagnostic tests noted below   []  Reviewed []  1 []  2 []  ? 3 prior external notes and incorporated into patient assessment   []  Independently interpreted studies as noted   []  Discussed management or test interpretation with external provider(s) as noted    LABS:   06/2022:   TSH 0.986  TPO Ab >1300    Assessment & Plan:     Melinda Mcguire is a 42 y.o. female with Hashimoto's hypothyroidism.  We reviewed her recent labs, and I discussed in detail the nature of hypothyroidism and its potential signs/symptoms.  She is biochemically euthyroid but continues to experience symptoms of hypothyroidism.  We had an extensive discussion regarding available literature on this field, as they were primarily interested in.  I shared that there has been some studies on selenium supplementation, gluten-free diet, and total thyroidectomy that have improved symptoms, though there were many limitations of these studies and  they are generally not to be recommended.  I advised continuing on the same dose of the Armour as she is taking, given the normal TSH value.  There are no plans for pregnancy thus no concern of taking this T3-containing preparation.     She can follow up as needed. I answered all of her questions and she conveyed her understanding.      Bufford Spikes MD, MSc, ECNU  Division of Endocrinology    Risk of Complication:     This Clinical research associate has deemed the above diagnoses to have a risk of complication, morbidity, or mortality of:   [x]  Minimal  []  Low  []  Moderate  []  Severe     Social Determinants of Health:     []  The diagnosis or treatment of said conditions is significantly limited by social determinants of health of this and work with her PCP to continue monitoring her TFTs over time.  I did not advise starting thyroid hormone medication at this time, given the above and that her symptoms may also be attributable to perimenopause.    ***I performed a brief thyroid ultrasound in the office today, which confirms a diffusely small heterogeneous gland consistent with Hashimoto's thyroiditis.      ***Hashimoto's hypothyroidism. We reviewed her recent labs, and I discussed in detail the nature of hypothyroidism and its potential signs/symptoms.  She is biochemically euthyroid but continues to experience symptoms of hypothyroidism.  We had an extensive discussion regarding the available options for thyroid hormone replacement, including T4, dessicated thyroid extract, and combination T3+T4 therapy, including the advantages and potential risks of each, particularly regarding T3 therapy on cardiac disease and bone health.  Any T3, if given, is recommended at a ratio of 1:13-1:20 of T3:T4 to minimize these risks.  Given this, she opts to *** and we will repeat a TSH in 4-6 weeks.  ***She feels that her symptoms are not too related to hypothyroidism and is happy to continue on LT4 alone.  We reviewed that titration of the serum TSH alone is recommended in individuals taking thyroid hormone, thus the peripheral serum thyroid hormone levels are usually not helpful.   We also reviewed the potential role of selenium 200 mcg daily and a gluten-free diet to decrease serum TPO Ab titers, although these have not been consistently shown to impact thyroid hormone requirements and I would not recommend them.      No Endocrine follow up needed at this time. I answered all of her questions and she conveyed her understanding.  Patient literature from the American Thyroid Association website regarding ***Hashimoto's thyroiditis ***hypothyroidism was given to the patient.    Bufford Spikes MD, MSc, ECNU  Division of Endocrinology    Risk of Complication:     This Clinical research associate has deemed the above diagnoses to have a risk of complication, morbidity, or mortality of:   []  Minimal  []  Low  []  Moderate  []  Severe     Social Determinants of Health:     []  The diagnosis or treatment of said conditions is significantly limited by social determinants of health        I spent {[phone]} minutes counseling and coordinating care for the items checked below on the day of service***  []  Preparing to see the patient (e.g., review of tests)  []  Obtaining and or reviewing separately obtained history   []  Performing a medically appropriate examination and/or evaluation   []  Counseling and educating the patient/family/caregiver   []  Ordering medications, tests,  or procedures  []  Referring and communicating with other healthcare professionals (when not separately reported)  []  Documenting clinical information in the EHR  []  Independently interpreting results and communicating results to patient/ family/caregiver

## 2022-08-21 ENCOUNTER — Telehealth: Payer: BLUE CROSS/BLUE SHIELD

## 2022-08-21 DIAGNOSIS — E038 Other specified hypothyroidism: Secondary | ICD-10-CM

## 2022-08-21 DIAGNOSIS — E063 Autoimmune thyroiditis: Secondary | ICD-10-CM

## 2022-08-25 DIAGNOSIS — F4322 Adjustment disorder with anxiety: Secondary | ICD-10-CM | POA: Diagnosis not present

## 2022-09-29 DIAGNOSIS — F4322 Adjustment disorder with anxiety: Secondary | ICD-10-CM | POA: Diagnosis not present

## 2022-10-20 DIAGNOSIS — F4322 Adjustment disorder with anxiety: Secondary | ICD-10-CM | POA: Diagnosis not present

## 2022-10-21 ENCOUNTER — Ambulatory Visit: Payer: Federal, State, Local not specified - PPO | Admitting: Nurse Practitioner

## 2022-11-03 ENCOUNTER — Encounter: Payer: Self-pay | Admitting: Nurse Practitioner

## 2022-11-03 ENCOUNTER — Ambulatory Visit: Payer: Federal, State, Local not specified - PPO | Admitting: Nurse Practitioner

## 2022-11-03 VITALS — BP 104/72 | HR 76 | Temp 99.5°F | Ht 59.88 in | Wt 183.2 lb

## 2022-11-03 DIAGNOSIS — F419 Anxiety disorder, unspecified: Secondary | ICD-10-CM | POA: Diagnosis not present

## 2022-11-03 DIAGNOSIS — Z020 Encounter for examination for admission to educational institution: Secondary | ICD-10-CM

## 2022-11-03 DIAGNOSIS — M5030 Other cervical disc degeneration, unspecified cervical region: Secondary | ICD-10-CM

## 2022-11-03 DIAGNOSIS — F339 Major depressive disorder, recurrent, unspecified: Secondary | ICD-10-CM

## 2022-11-03 DIAGNOSIS — F32A Depression, unspecified: Secondary | ICD-10-CM | POA: Diagnosis not present

## 2022-11-03 NOTE — Assessment & Plan Note (Signed)
Chronic.  Ongoing. Continue with therapy.  Doing well without medication.   Follow up in 6 months.  Call sooner if concerns arise.

## 2022-11-03 NOTE — Progress Notes (Signed)
BP 104/72   Pulse 76   Temp 99.5 F (37.5 C) (Oral)   Ht 4' 11.88" (1.521 m)   Wt 183 lb 3.2 oz (83.1 kg)   SpO2 100%   BMI 35.92 kg/m    Subjective:    Patient ID: Stacy Yang, female    DOB: 1980-10-21, 42 y.o.   MRN: [id number]  HPI: Stacy WILLIQUETTE is a 42 y.o. female  Chief Complaint  Patient presents with   Anxiety   Depression   Spasms    Patients has been having neck spasms for past month, can make it uncomfortable at work and at night   Weight loss    Wants to discuss weight loss.    DEPRESSION Patient states she has been really busy lately.  She has been very active.  Still in therapy twice a month.  She is having weird dreams that she is talking to her therapist about.  Being busy has helped keeping her mind busy.  Her stress and mood have been affected by her finances.  Mood status: stable Satisfied with current treatment?: yes Symptom severity: moderate  Duration of current treatment : years Side effects: no Medication compliance: excellent compliance Psychotherapy/counseling: yes current Previous psychiatric medications: not currently on medications Depressed mood: yes Anxious mood: yes Anhedonia: no Significant weight loss or gain: no Insomnia: no  Fatigue: yes Feelings of worthlessness or guilt: no Impaired concentration/indecisiveness: no Suicidal ideations: no Hopelessness: no Crying spells: no    11/03/2022    1:34 PM 05/19/2022    3:20 PM 04/22/2022    9:28 AM 03/18/2022    8:59 AM 07/24/2020    1:17 PM  Depression screen PHQ 2/9  Decreased Interest 1 2 2 1 3   Down, Depressed, Hopeless 1 2 2 1 3   PHQ - 2 Score 2 4 4 2 6   Altered sleeping 1 2 2 1 3   Tired, decreased energy 2 2 2 3 3   Change in appetite 3 0 1 0 3  Feeling bad or failure about yourself  0 2 2 1 3   Trouble concentrating 0 0 1 0 3  Moving slowly or fidgety/restless 0 0 0 0 3  Suicidal thoughts 0 0 0 0 0  PHQ-9 Score 8 10 12 7 24   Difficult doing work/chores Somewhat  difficult Not difficult at all Somewhat difficult Somewhat difficult Very difficult   Patient states she has been having neck spasms.  She is having a lot of work stress.  Feels like it is putting a lot of strain on her neck.  She is ready to see a specialist.      Relevant past medical, surgical, family and social history reviewed and updated as indicated. Interim medical history since our last visit reviewed. Allergies and medications reviewed and updated.  Review of Systems  Psychiatric/Behavioral:  Positive for dysphoric mood. Negative for suicidal ideas. The patient is nervous/anxious.     Per HPI unless specifically indicated above     Objective:    BP 104/72   Pulse 76   Temp 99.5 F (37.5 C) (Oral)   Ht 4' 11.88" (1.521 m)   Wt 183 lb 3.2 oz (83.1 kg)   SpO2 100%   BMI 35.92 kg/m   Wt Readings from Last 3 Encounters:  11/03/22 183 lb 3.2 oz (83.1 kg)  05/19/22 177 lb 1.6 oz (80.3 kg)  04/22/22 177 lb 9.6 oz (80.6 kg)    Physical Exam Vitals and nursing note reviewed.  Constitutional:      General: She is not in acute distress.    Appearance: Normal appearance. She is normal weight. She is not ill-appearing, toxic-appearing or diaphoretic.  HENT:     Head: Normocephalic.     Right Ear: External ear normal.     Left Ear: External ear normal.     Nose: Nose normal.     Mouth/Throat:     Mouth: Mucous membranes are moist.     Pharynx: Oropharynx is clear.  Eyes:     General:        Right eye: No discharge.        Left eye: No discharge.     Extraocular Movements: Extraocular movements intact.     Conjunctiva/sclera: Conjunctivae normal.     Pupils: Pupils are equal, round, and reactive to light.  Cardiovascular:     Rate and Rhythm: Normal rate and regular rhythm.     Heart sounds: No murmur heard. Pulmonary:     Effort: Pulmonary effort is normal. No respiratory distress.     Breath sounds: Normal breath sounds. No wheezing or rales.  Musculoskeletal:      Cervical back: Normal range of motion and neck supple.  Skin:    General: Skin is warm and dry.     Capillary Refill: Capillary refill takes less than 2 seconds.  Neurological:     General: No focal deficit present.     Mental Status: She is alert and oriented to person, place, and time. Mental status is at baseline.  Psychiatric:        Mood and Affect: Mood normal.        Behavior: Behavior normal.        Thought Content: Thought content normal.        Judgment: Judgment normal.     Results for orders placed or performed in visit on 04/22/22  CBC with Differential/Platelet  Result Value Ref Range   WBC 6.1 3.4 - 10.8 x10E3/uL   RBC 4.72 3.77 - 5.28 x10E6/uL   Hemoglobin 13.6 11.1 - 15.9 g/dL   Hematocrit 41.7 34.0 - 46.6 %   MCV 88 79 - 97 fL   MCH 28.8 26.6 - 33.0 pg   MCHC 32.6 31.5 - 35.7 g/dL   RDW 12.9 11.7 - 15.4 %   Platelets 219 150 - 450 x10E3/uL   Neutrophils 65 Not Estab. %   Lymphs 27 Not Estab. %   Monocytes 6 Not Estab. %   Eos 2 Not Estab. %   Basos 0 Not Estab. %   Neutrophils Absolute 3.9 1.4 - 7.0 x10E3/uL   Lymphocytes Absolute 1.7 0.7 - 3.1 x10E3/uL   Monocytes Absolute 0.4 0.1 - 0.9 x10E3/uL   EOS (ABSOLUTE) 0.1 0.0 - 0.4 x10E3/uL   Basophils Absolute 0.0 0.0 - 0.2 x10E3/uL   Immature Granulocytes 0 Not Estab. %   Immature Grans (Abs) 0.0 0.0 - 0.1 x10E3/uL  Comprehensive metabolic panel  Result Value Ref Range   Glucose 91 70 - 99 mg/dL   BUN 19 6 - 24 mg/dL   Creatinine, Ser 0.80 0.57 - 1.00 mg/dL   eGFR 94 >59 mL/min/1.73   BUN/Creatinine Ratio 24 (H) 9 - 23   Sodium 140 134 - 144 mmol/L   Potassium 4.2 3.5 - 5.2 mmol/L   Chloride 103 96 - 106 mmol/L   CO2 23 20 - 29 mmol/L   Calcium 9.2 8.7 - 10.2 mg/dL   Total Protein 6.7 6.0 - 8.5  g/dL   Albumin 4.1 3.9 - 4.9 g/dL   Globulin, Total 2.6 1.5 - 4.5 g/dL   Albumin/Globulin Ratio 1.6 1.2 - 2.2   Bilirubin Total 0.7 0.0 - 1.2 mg/dL   Alkaline Phosphatase 65 44 - 121 IU/L   AST 15 0  - 40 IU/L   ALT 13 0 - 32 IU/L  Lipid panel  Result Value Ref Range   Cholesterol, Total 181 100 - 199 mg/dL   Triglycerides 119 0 - 149 mg/dL   HDL 46 >39 mg/dL   VLDL Cholesterol Cal 21 5 - 40 mg/dL   LDL Chol Calc (NIH) 114 (H) 0 - 99 mg/dL   Chol/HDL Ratio 3.9 0.0 - 4.4 ratio  TSH  Result Value Ref Range   TSH 1.270 0.450 - 4.500 uIU/mL  Urinalysis, Routine w reflex microscopic  Result Value Ref Range   Specific Gravity, UA 1.020 1.005 - 1.030   pH, UA 7.0 5.0 - 7.5   Color, UA Yellow Yellow   Appearance Ur Clear Clear   Leukocytes,UA Negative Negative   Protein,UA Negative Negative/Trace   Glucose, UA Negative Negative   Ketones, UA Negative Negative   RBC, UA Negative Negative   Bilirubin, UA Negative Negative   Urobilinogen, Ur 1.0 0.2 - 1.0 mg/dL   Nitrite, UA Negative Negative  HIV Antibody (routine testing w rflx)  Result Value Ref Range   HIV Screen 4th Generation wRfx Non Reactive Non Reactive  Hepatitis C Antibody  Result Value Ref Range   Hep C Virus Ab Non Reactive Non Reactive  Cytology - PAP  Result Value Ref Range   Adequacy      Satisfactory for evaluation; transformation zone component PRESENT.   Diagnosis      - Negative for intraepithelial lesion or malignancy (NILM)      Assessment & Plan:   Problem List Items Addressed This Visit       Other   Anxiety and depression    Chronic.  Ongoing. Continue with therapy.  Doing well without medication.   Follow up in 6 months.  Call sooner if concerns arise.       Relevant Orders   Comp Met (CMET)   Depression, recurrent ([place]) - Primary    Chronic.  Ongoing. Continue with therapy.  Doing well without medication.   Follow up in 6 months.  Call sooner if concerns arise.       Relevant Orders   Comp Met (CMET)   Other Visit Diagnoses     Bulge of cervical disc without myelopathy       Referrral placed for Orthopedics.   Relevant Orders   Ambulatory referral to Orthopedic Surgery   School  health examination       Relevant Orders   QuantiFERON-TB Gold Plus        Follow up plan: No follow-ups on file.

## 2022-11-10 DIAGNOSIS — M5412 Radiculopathy, cervical region: Secondary | ICD-10-CM | POA: Diagnosis not present

## 2022-11-11 LAB — COMPREHENSIVE METABOLIC PANEL
ALT: 12 IU/L (ref 0–32)
AST: 15 IU/L (ref 0–40)
Albumin/Globulin Ratio: 1.7 (ref 1.2–2.2)
Albumin: 4.1 g/dL (ref 3.9–4.9)
Alkaline Phosphatase: 75 IU/L (ref 44–121)
BUN/Creatinine Ratio: 16 (ref 9–23)
BUN: 13 mg/dL (ref 6–24)
Bilirubin Total: 0.8 mg/dL (ref 0.0–1.2)
CO2: 23 mmol/L (ref 20–29)
Calcium: 9.3 mg/dL (ref 8.7–10.2)
Chloride: 104 mmol/L (ref 96–106)
Creatinine, Ser: 0.83 mg/dL (ref 0.57–1.00)
Globulin, Total: 2.4 g/dL (ref 1.5–4.5)
Glucose: 96 mg/dL (ref 70–99)
Potassium: 4.2 mmol/L (ref 3.5–5.2)
Sodium: 141 mmol/L (ref 134–144)
Total Protein: 6.5 g/dL (ref 6.0–8.5)
eGFR: 90 mL/min/{1.73_m2} (ref >59)

## 2022-11-11 LAB — QUANTIFERON-TB GOLD PLUS
QuantiFERON Mitogen Value: >10 IU/mL
QuantiFERON Nil Value: 0.06 IU/mL
QuantiFERON TB1 Ag Value: 0.14 IU/mL
QuantiFERON TB2 Ag Value: 0.08 IU/mL
QuantiFERON-TB Gold Plus: NEGATIVE

## 2022-11-11 NOTE — Progress Notes (Signed)
HI Stacy Yang. It was nice to see you yesterday.  Your lab work looks good.  No concerns at this time. Continue with your current medication regimen.  Follow up as discussed.  Please let me know if you have any questions.

## 2023-03-02 ENCOUNTER — Encounter: Payer: Self-pay | Admitting: Physician Assistant

## 2023-03-02 ENCOUNTER — Ambulatory Visit: Payer: Federal, State, Local not specified - PPO | Admitting: Physician Assistant

## 2023-03-02 VITALS — BP 129/77 | HR 94 | Temp 98.7°F | Ht 59.0 in | Wt 193.0 lb

## 2023-03-02 DIAGNOSIS — R519 Headache, unspecified: Secondary | ICD-10-CM

## 2023-03-02 DIAGNOSIS — N760 Acute vaginitis: Secondary | ICD-10-CM | POA: Diagnosis not present

## 2023-03-02 DIAGNOSIS — R350 Frequency of micturition: Secondary | ICD-10-CM

## 2023-03-02 DIAGNOSIS — R42 Dizziness and giddiness: Secondary | ICD-10-CM | POA: Diagnosis not present

## 2023-03-02 DIAGNOSIS — B9689 Other specified bacterial agents as the cause of diseases classified elsewhere: Secondary | ICD-10-CM

## 2023-03-02 LAB — URINALYSIS, ROUTINE W REFLEX MICROSCOPIC: Specific Gravity, UA: 1.025 (ref 1.005–1.030)

## 2023-03-02 LAB — MICROSCOPIC EXAMINATION: Bacteria, UA: NONE SEEN

## 2023-03-02 LAB — WET PREP FOR TRICH, YEAST, CLUE
Clue Cell Exam: POSITIVE — AB
Trichomonas Exam: NEGATIVE
Yeast Exam: NEGATIVE

## 2023-03-02 MED ORDER — METRONIDAZOLE 500 MG PO TABS: 500.0000 mg | ORAL_TABLET | Freq: Two times a day (BID) | ORAL | 0 refills | Status: DC

## 2023-03-02 NOTE — Progress Notes (Signed)
Your cervicovaginal swab was positive for bacterial vaginosis.  I have sent in a script for Flagyl to be taken by mouth twice per day for 7 days. Please finish the entire course unless you are instructed to stop or develop an allergic reaction. Please note that this medication can cause severe nausea and vomiting if alcohol is consumed while you are taking it so please refrain from this during the week you are on it. Please let us know if you have further questions or concerns.  Your urinalysis was contaminated with red blood cells, likely from your period, so we are sending a sample for culture. The results should be back in a few days - we will keep you updated once they are available.

## 2023-03-02 NOTE — Patient Instructions (Addendum)
I recommend trying to return to your habits from before- drinking plenty of water and trying to eat healthier  This will likely help with the headaches and dizziness We will keep you updated on the results of your labs once they are available   You can use Flonase to help with your nasal symptoms  Please let us know if you have further questions or concerns.   It was nice to meet you and I appreciate the opportunity to be involved in your care If you were satisfied with the care you received from me, I would greatly appreciate you saying so in the after-visit survey that is sent out following our visit.

## 2023-03-02 NOTE — Progress Notes (Signed)
Acute Office Visit   Patient: Stacy Yang   DOB: 1981/03/05   42 y.o. Female  MRN: [id number] Visit Date: 03/02/2023  Today's healthcare provider: Oswaldo Conroy Amery Minasyan, PA-C  Introduced myself to the patient as a Secondary school teacher and provided education on APPs in clinical practice.    Chief Complaint  Patient presents with   Dizziness   Headache   Urinary Frequency   Weight Gain    Patient says she is gaining weight and says she has not been able to workout anymore and says she is interested in moving forward with weight loss medication. Patient says she has discussed in the past with Clydie Braun. Patient says she is concerned about why her weight is coming back so fast.    Hypertension    Patient says she has been noticing an elevation in her blood pressure recently. Patient says she has been have a throbbing headache on the R side of her head for three days. Patient says when she checked her BP is was elevation.    Subjective    HPI HPI     Weight Gain    Additional comments: Patient says she is gaining weight and says she has not been able to workout anymore and says she is interested in moving forward with weight loss medication. Patient says she has discussed in the past with Clydie Braun. Patient says she is concerned about why her weight is coming back so fast.         Hypertension    Additional comments: Patient says she has been noticing an elevation in her blood pressure recently. Patient says she has been have a throbbing headache on the R side of her head for three days. Patient says when she checked her BP is was elevation.       Last edited by Malen Gauze, CMA on 03/02/2023 11:00 AM.       Concern for several issues today She reports she has been gaining weight since Jan after going back to school  She states she has stopped going to the gym and is not eating as good as she was She states the last few weeks she has gained about 10 lbs  Last week she reports having a  headache for 3 days - over the right eye, was throbbing She reports an elevation to her BP (137/80s) but this was not during the headache flare  She reports feeling sluggish lately and has been feeling dizzy  She states she knows she is not drinking enough water and eating healthy  She states she has some swelling and tenderness to her nose for the past few weeks  She reports having some trouble breathing through nose due to swelling   She reports she has also been having some urinary frequency, urinary hesitancy  She denies fevers, sore throat, dysuria      Medications: Outpatient Medications Prior to Visit  Medication Sig   benzonatate (TESSALON) 200 MG capsule Take 1 capsule (200 mg total) by mouth 2 (two) times daily as needed for cough. (Patient not taking: Reported on 03/02/2023)   predniSONE (DELTASONE) 20 MG tablet Take 60mg  PO daily x 2 days, then40mg  PO daily x 2 days, then 20mg  PO daily x 3 days (Patient not taking: Reported on 03/02/2023)   No facility-administered medications prior to visit.    Review of Systems  Constitutional:  Positive for fatigue. Negative for chills, diaphoresis and fever.  Gastrointestinal:  Negative  for abdominal pain, nausea and vomiting.  Genitourinary:  Positive for frequency and urgency. Negative for decreased urine volume, difficulty urinating, dyspareunia, dysuria, enuresis, hematuria, vaginal bleeding, vaginal discharge and vaginal pain.  Neurological:  Positive for dizziness and headaches.         Objective    BP 129/77   Pulse 94   Temp 98.7 F (37.1 C) (Oral)   Ht 4\' 11"  (1.499 m)   Wt 193 lb (87.5 kg)   SpO2 100%   BMI 38.98 kg/m      Physical Exam Vitals reviewed.  Constitutional:      General: She is awake.     Appearance: Normal appearance. She is well-developed and well-groomed.  HENT:     Head: Normocephalic and atraumatic.     Nose: Nasal tenderness present. No nasal deformity, septal deviation, laceration  or mucosal edema.     Right Turbinates: Swollen.     Left Turbinates: Swollen.     Mouth/Throat:     Lips: Pink.     Mouth: Mucous membranes are moist.     Pharynx: Oropharynx is clear. Uvula midline. No oropharyngeal exudate or posterior oropharyngeal erythema.  Eyes:     General: Lids are normal. Gaze aligned appropriately.     Extraocular Movements: Extraocular movements intact.     Conjunctiva/sclera: Conjunctivae normal.  Cardiovascular:     Rate and Rhythm: Normal rate and regular rhythm.     Pulses: Normal pulses.          Radial pulses are 2+ on the right side and 2+ on the left side.     Heart sounds: Normal heart sounds.  Pulmonary:     Effort: Pulmonary effort is normal.     Breath sounds: Normal breath sounds. No decreased air movement. No decreased breath sounds, wheezing, rhonchi or rales.  Musculoskeletal:     Right lower leg: No edema.     Left lower leg: No edema.  Skin:    General: Skin is warm and dry.  Neurological:     General: No focal deficit present.     Mental Status: She is alert and oriented to person, place, and time.     GCS: GCS eye subscore is 4. GCS verbal subscore is 5. GCS motor subscore is 6.     Cranial Nerves: No dysarthria or facial asymmetry.     Motor: No weakness or tremor.  Psychiatric:        Attention and Perception: Attention and perception normal.        Mood and Affect: Mood and affect normal.        Speech: Speech normal.        Behavior: Behavior normal. Behavior is cooperative.       No results found for any visits on 03/02/23.  Assessment & Plan      No follow-ups on file.       Problem List Items Addressed This Visit   None Visit Diagnoses     Urinary frequency    -  Primary Acute, new concern  Pt reports increased urinary frequency and dizziness Will check UA and wet prep- wet prep was positive for clue cells, will send in script for Flagyl 500 mg PO BID x 7 days  UA was influenced by patient having menses so  will send for culture for definitive rule out- results to dictate further management Recommend she increase daily water intake and avoid holding urine for prolonged periods of time    Relevant  Orders   Urinalysis, Routine w reflex microscopic   Urine Culture   Dizziness     Acute, new concern She reports changes to her diet and exercise along with decreased water intake since she started school Suspect reduced hydration is likely cause for dizziness and headaches Will check CMP, CBC, TSH, UA for rule out- results to dictate further management Recommend returning to previous dietary habits, increasing water intake, can use Tylenol and Ibuprofen for pains, using Flonase for rhinitis symptoms Follow up as needed for persistent or progressing symptoms     Relevant Orders   Comp Met (CMET)   CBC w/Diff   TSH   WET PREP FOR TRICH, YEAST, CLUE   Lipid Profile   Acute nonintractable headache, unspecified headache type     See dizziness A&P for work up    Relevant Orders   Comp Met (CMET)   CBC w/Diff   Lipid Profile   BV (bacterial vaginosis)            No follow-ups on file.   I, Haili Donofrio E Amalea Ottey, PA-C, have reviewed all documentation for this visit. The documentation on 03/02/23 for the exam, diagnosis, procedures, and orders are all accurate and complete.   Jacquelin Hawking, MHS, PA-C [facility]

## 2023-03-03 LAB — CBC WITH DIFFERENTIAL/PLATELET
Basophils Absolute: 0 10*3/uL (ref 0.0–0.2)
Basos: 0 %
EOS (ABSOLUTE): 0.2 10*3/uL (ref 0.0–0.4)
Eos: 3 %
Hematocrit: 42.1 % (ref 34.0–46.6)
Hemoglobin: 13.6 g/dL (ref 11.1–15.9)
Immature Grans (Abs): 0 10*3/uL (ref 0.0–0.1)
Immature Granulocytes: 0 %
Lymphocytes Absolute: 1.9 10*3/uL (ref 0.7–3.1)
Lymphs: 21 %
MCH: 28.9 pg (ref 26.6–33.0)
MCHC: 32.3 g/dL (ref 31.5–35.7)
MCV: 89 fL (ref 79–97)
Monocytes Absolute: 0.5 10*3/uL (ref 0.1–0.9)
Monocytes: 6 %
Neutrophils Absolute: 6.2 10*3/uL (ref 1.4–7.0)
Neutrophils: 70 %
Platelets: 264 10*3/uL (ref 150–450)
RBC: 4.71 x10E6/uL (ref 3.77–5.28)
RDW: 12.7 % (ref 11.7–15.4)
WBC: 8.9 10*3/uL (ref 3.4–10.8)

## 2023-03-03 LAB — LIPID PANEL
Chol/HDL Ratio: 3.5 ratio (ref 0.0–4.4)
Cholesterol, Total: 180 mg/dL (ref 100–199)
HDL: 52 mg/dL (ref >39)
LDL Chol Calc (NIH): 108 mg/dL — ABNORMAL HIGH (ref 0–99)
Triglycerides: 114 mg/dL (ref 0–149)
VLDL Cholesterol Cal: 20 mg/dL (ref 5–40)

## 2023-03-03 LAB — COMPREHENSIVE METABOLIC PANEL
ALT: 10 IU/L (ref 0–32)
AST: 14 IU/L (ref 0–40)
Albumin: 4.3 g/dL (ref 3.9–4.9)
Alkaline Phosphatase: 85 IU/L (ref 44–121)
BUN/Creatinine Ratio: 15 (ref 9–23)
BUN: 13 mg/dL (ref 6–24)
Bilirubin Total: 0.9 mg/dL (ref 0.0–1.2)
CO2: 24 mmol/L (ref 20–29)
Calcium: 9.1 mg/dL (ref 8.7–10.2)
Chloride: 103 mmol/L (ref 96–106)
Creatinine, Ser: 0.86 mg/dL (ref 0.57–1.00)
Globulin, Total: 2.4 g/dL (ref 1.5–4.5)
Glucose: 100 mg/dL — ABNORMAL HIGH (ref 70–99)
Potassium: 4 mmol/L (ref 3.5–5.2)
Sodium: 140 mmol/L (ref 134–144)
Total Protein: 6.7 g/dL (ref 6.0–8.5)
eGFR: 86 mL/min/{1.73_m2} (ref >59)

## 2023-03-03 LAB — TSH: TSH: 1.53 u[IU]/mL (ref 0.450–4.500)

## 2023-03-04 LAB — URINE CULTURE

## 2023-03-05 ENCOUNTER — Ambulatory Visit: Payer: Self-pay | Admitting: *Deleted

## 2023-03-05 NOTE — Telephone Encounter (Signed)
  Chief Complaint: Pt called in and was given her lab result message.  She saw it on MyChart and is already taking the medication for the BV. Symptoms: N/A Frequency: N/A Pertinent Negatives: Patient denies N/A Disposition: [] ED /[] Urgent Care (no appt availability in office) / [] Appointment(In office/virtual)/ []  [facility]/ [] Home Care/ [] Refused Recommended Disposition /[] [facility]/ []  Follow-up with PCP Additional Notes: Lab results given

## 2023-03-05 NOTE — Telephone Encounter (Signed)
Reason for Disposition  [1] Follow-up call to recent contact AND [2] information only call, no triage required  Answer Assessment - Initial Assessment Questions 1. REASON FOR CALL or QUESTION: "What is your reason for calling today?" or "How can I best help you?" or "What question do you have that I can help answer?"     Pt given lab results per notes of Erin Mecum, PA-C on 03/02/2023 at 5:00 PM. Pt verbalized understanding.   She is already taking the medicine for the BV.  No questions  Protocols used: Information Only Call - No Triage-A-AH

## 2023-03-06 NOTE — Progress Notes (Signed)
Your labs are back Your urine culture did not grow back worrisome bacteria that require treatment with an antibiotic Your electrolytes, liver and kidney function were in normal ranges Your cholesterol looks good- please stay active and monitor your consumption of saturated fats Your thyroid testing was normal as was your blood count.

## 2023-04-05 ENCOUNTER — Telehealth: Payer: Federal, State, Local not specified - PPO | Admitting: Family Medicine

## 2023-04-05 DIAGNOSIS — J019 Acute sinusitis, unspecified: Secondary | ICD-10-CM

## 2023-04-05 DIAGNOSIS — B9689 Other specified bacterial agents as the cause of diseases classified elsewhere: Secondary | ICD-10-CM | POA: Diagnosis not present

## 2023-04-05 MED ORDER — AMOXICILLIN-POT CLAVULANATE 875-125 MG PO TABS: 1.0000 | ORAL_TABLET | Freq: Two times a day (BID) | ORAL | 0 refills | Status: DC

## 2023-04-05 NOTE — Patient Instructions (Signed)
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
## 2023-06-08 ENCOUNTER — Encounter: Payer: Federal, State, Local not specified - PPO | Admitting: Nurse Practitioner

## 2023-06-08 DIAGNOSIS — Z136 Encounter for screening for cardiovascular disorders: Secondary | ICD-10-CM

## 2023-06-08 DIAGNOSIS — E669 Obesity, unspecified: Secondary | ICD-10-CM

## 2023-06-08 DIAGNOSIS — Z0000 Encounter for general adult medical examination without abnormal findings: Secondary | ICD-10-CM

## 2023-06-08 DIAGNOSIS — F339 Major depressive disorder, recurrent, unspecified: Secondary | ICD-10-CM

## 2023-06-08 DIAGNOSIS — R030 Elevated blood-pressure reading, without diagnosis of hypertension: Secondary | ICD-10-CM

## 2023-06-24 NOTE — Progress Notes (Signed)
BP 127/79 (BP Location: Left Arm, Patient Position: Sitting, Cuff Size: Large)   Pulse 82   Temp 98.4 F (36.9 C) (Oral)   Ht 5' (1.524 m)   Wt 200 lb 9.6 oz (91 kg)   SpO2 99%   BMI 39.18 kg/m    Subjective:    Patient ID: Stacy Yang, female    DOB: [birth year], 42 y.o.   MRN: [id number]  HPI: Stacy Yang is a 42 y.o. female presenting on 06/25/2023 for comprehensive medical examination. Current medical complaints include:none  She currently lives with: Menopausal Symptoms: no  Patient states she has had trouble with gaining weight.  Having difficulty with healthy eating due to the extra expense.  She has noticed that she is really more tired and constipated.    MOOD Has been a little bit sad in the last couple of days due to her son's upcoming anniversary of his passing.  This is a stressful time of year for her.    Depression Screen done today and results listed below:     06/25/2023    3:26 PM 03/02/2023   11:03 AM 11/03/2022    1:34 PM 05/19/2022    3:20 PM 04/22/2022    9:28 AM  Depression screen PHQ 2/9  Decreased Interest 1 1 1 2 2   Down, Depressed, Hopeless 1 1 1 2 2   PHQ - 2 Score 2 2 2 4 4   Altered sleeping 1 1 1 2 2   Tired, decreased energy 3 3 2 2 2   Change in appetite 3 3 3  0 1  Feeling bad or failure about yourself  0 1 0 2 2  Trouble concentrating 1 1 0 0 1  Moving slowly or fidgety/restless 0 0 0 0 0  Suicidal thoughts 0 0 0 0 0  PHQ-9 Score 10 11 8 10 12   Difficult doing work/chores  Somewhat difficult Somewhat difficult Not difficult at all Somewhat difficult    The patient does not have a history of falls. I did complete a risk assessment for falls. A plan of care for falls was documented.   Past Medical History:  Past Medical History:  Diagnosis Date   Acne    Asthma    Cardiomyopathy (HCC)    Family history of lung cancer    Family history of pancreatic cancer     Surgical History:  Past Surgical History:  Procedure  Laterality Date   CESAREAN SECTION     x3   COLONOSCOPY WITH PROPOFOL N/A 11/07/2019   Procedure: COLONOSCOPY WITH PROPOFOL;  Surgeon: Toney Reil, MD;  Location: [facility];  Service: Gastroenterology;  Laterality: N/A;   ESOPHAGOGASTRODUODENOSCOPY (EGD) WITH PROPOFOL N/A 07/15/2017   Procedure: ESOPHAGOGASTRODUODENOSCOPY (EGD) WITH PROPOFOL;  Surgeon: Toney Reil, MD;  Location: [facility];  Service: Endoscopy;  Laterality: N/A;   TUBAL LIGATION      Medications:  No current outpatient medications on file prior to visit.   No current facility-administered medications on file prior to visit.    Allergies:  Allergies  Allergen Reactions   Elemental Sulfur Hives   Iodinated Contrast Media Hives   Iodine Swelling    Social History:  Social History   Socioeconomic History   Marital status: Divorced    Spouse name: Not on file   Number of children: Not on file   Years of education: Not on file   Highest education level: Not on file  Occupational History   Not on file  Tobacco Use   Smoking status: Never   Smokeless tobacco: Never  Vaping Use   Vaping status: Never Used  Substance and Sexual Activity   Alcohol use: No   Drug use: No   Sexual activity: Yes  Other Topics Concern   Not on file  Social History Narrative   Dental asst at the [employer]   Daughters    Son died Jul 17, 2020 GSW   1 grandaughter 43 y.o as of 07/24/20    Social Determinants of Health   Financial Resource Strain: Not on file  Food Insecurity: Not on file  Transportation Needs: Not on file  Physical Activity: Not on file  Stress: Not on file  Social Connections: Not on file  Intimate Partner Violence: Not on file   Social History   Tobacco Use  Smoking Status Never  Smokeless Tobacco Never   Social History   Substance and Sexual Activity  Alcohol Use No    Family History:  Family History  Problem Relation Age of Onset   Kidney disease Mother    Alcohol abuse  Father    Lung cancer Father    Alcohol abuse Maternal Aunt    Diabetes Maternal Aunt    Pancreatic cancer Paternal Uncle    Diabetes Maternal Grandmother    Dementia Paternal Grandmother    Cirrhosis Paternal Grandfather    Pancreatic cancer Paternal Grandfather     Past medical history, surgical history, medications, allergies, family history and social history reviewed with patient today and changes made to appropriate areas of the chart.   Review of Systems  Psychiatric/Behavioral:  Positive for depression. Negative for suicidal ideas. The patient is nervous/anxious.    All other ROS negative except what is listed above and in the HPI.      Objective:    BP 127/79 (BP Location: Left Arm, Patient Position: Sitting, Cuff Size: Large)   Pulse 82   Temp 98.4 F (36.9 C) (Oral)   Ht 5' (1.524 m)   Wt 200 lb 9.6 oz (91 kg)   SpO2 99%   BMI 39.18 kg/m   Wt Readings from Last 3 Encounters:  06/25/23 200 lb 9.6 oz (91 kg)  03/02/23 193 lb (87.5 kg)  11/03/22 183 lb 3.2 oz (83.1 kg)    Physical Exam Vitals and nursing note reviewed.  Constitutional:      General: She is awake. She is not in acute distress.    Appearance: Normal appearance. She is well-developed. She is obese. She is not ill-appearing.  HENT:     Head: Normocephalic and atraumatic.     Right Ear: Hearing, tympanic membrane, ear canal and external ear normal. No drainage.     Left Ear: Hearing, tympanic membrane, ear canal and external ear normal. No drainage.     Nose: Nose normal.     Right Sinus: No maxillary sinus tenderness or frontal sinus tenderness.     Left Sinus: No maxillary sinus tenderness or frontal sinus tenderness.     Mouth/Throat:     Mouth: Mucous membranes are moist.     Pharynx: Oropharynx is clear. Uvula midline. No pharyngeal swelling, oropharyngeal exudate or posterior oropharyngeal erythema.  Eyes:     General: Lids are normal.        Right eye: No discharge.        Left eye: No  discharge.     Extraocular Movements: Extraocular movements intact.     Conjunctiva/sclera: Conjunctivae normal.     Pupils: Pupils are  equal, round, and reactive to light.     Visual Fields: Right eye visual fields normal and left eye visual fields normal.  Neck:     Thyroid: No thyromegaly.     Vascular: No carotid bruit.     Trachea: Trachea normal.  Cardiovascular:     Rate and Rhythm: Normal rate and regular rhythm.     Heart sounds: Normal heart sounds. No murmur heard.    No gallop.  Pulmonary:     Effort: Pulmonary effort is normal. No accessory muscle usage or respiratory distress.     Breath sounds: Normal breath sounds.  Chest:  Breasts:    Right: Normal.     Left: Normal.  Abdominal:     General: Bowel sounds are normal.     Palpations: Abdomen is soft. There is no hepatomegaly or splenomegaly.     Tenderness: There is no abdominal tenderness.  Musculoskeletal:        General: Normal range of motion.     Cervical back: Normal range of motion and neck supple.     Right lower leg: No edema.     Left lower leg: No edema.  Lymphadenopathy:     Head:     Right side of head: No submental, submandibular, tonsillar, preauricular or posterior auricular adenopathy.     Left side of head: No submental, submandibular, tonsillar, preauricular or posterior auricular adenopathy.     Cervical: No cervical adenopathy.     Upper Body:     Right upper body: No supraclavicular, axillary or pectoral adenopathy.     Left upper body: No supraclavicular, axillary or pectoral adenopathy.  Skin:    General: Skin is warm and dry.     Capillary Refill: Capillary refill takes less than 2 seconds.     Findings: No rash.  Neurological:     Mental Status: She is alert and oriented to person, place, and time.     Gait: Gait is intact.  Psychiatric:        Attention and Perception: Attention normal.        Mood and Affect: Mood normal.        Speech: Speech normal.        Behavior: Behavior  normal. Behavior is cooperative.        Thought Content: Thought content normal.        Judgment: Judgment normal.     Results for orders placed or performed in visit on 06/25/23  Microscopic Examination   Urine  Result Value Ref Range   WBC, UA 0-5 0 - 5 /hpf   RBC, Urine >30R 0 - 2 /hpf   Epithelial Cells (non renal) 0-10 0 - 10 /hpf   Bacteria, UA None seen None seen/Few  CBC with Differential/Platelet  Result Value Ref Range   WBC 9.2 3.4 - 10.8 x10E3/uL   RBC 4.47 3.77 - 5.28 x10E6/uL   Hemoglobin 13.2 11.1 - 15.9 g/dL   Hematocrit 06.2 37.6 - 46.6 %   MCV 89 79 - 97 fL   MCH 29.5 26.6 - 33.0 pg   MCHC 33.2 31.5 - 35.7 g/dL   RDW 28.3 15.1 - 76.1 %   Platelets 247 150 - 450 x10E3/uL   Neutrophils 65 Not Estab. %   Lymphs 25 Not Estab. %   Monocytes 7 Not Estab. %   Eos 2 Not Estab. %   Basos 0 Not Estab. %   Neutrophils Absolute 6.0 1.4 - 7.0 x10E3/uL   Lymphocytes  Absolute 2.3 0.7 - 3.1 x10E3/uL   Monocytes Absolute 0.7 0.1 - 0.9 x10E3/uL   EOS (ABSOLUTE) 0.1 0.0 - 0.4 x10E3/uL   Basophils Absolute 0.0 0.0 - 0.2 x10E3/uL   Immature Granulocytes 1 Not Estab. %   Immature Grans (Abs) 0.1 0.0 - 0.1 x10E3/uL  Comprehensive metabolic panel  Result Value Ref Range   Glucose 121 (H) 70 - 99 mg/dL   BUN 15 6 - 24 mg/dL   Creatinine, Ser 8.29 0.57 - 1.00 mg/dL   eGFR 562 >13 YQ/MVH/8.46   BUN/Creatinine Ratio 20 9 - 23   Sodium 139 134 - 144 mmol/L   Potassium 4.0 3.5 - 5.2 mmol/L   Chloride 101 96 - 106 mmol/L   CO2 24 20 - 29 mmol/L   Calcium 9.2 8.7 - 10.2 mg/dL   Total Protein 6.8 6.0 - 8.5 g/dL   Albumin 4.2 3.9 - 4.9 g/dL   Globulin, Total 2.6 1.5 - 4.5 g/dL   Bilirubin Total 0.5 0.0 - 1.2 mg/dL   Alkaline Phosphatase 74 44 - 121 IU/L   AST 18 0 - 40 IU/L   ALT 17 0 - 32 IU/L  Lipid panel  Result Value Ref Range   Cholesterol, Total 172 100 - 199 mg/dL   Triglycerides 962 (H) 0 - 149 mg/dL   HDL 46 >95 mg/dL   VLDL Cholesterol Cal 31 5 - 40 mg/dL   LDL  Chol Calc (NIH) 95 0 - 99 mg/dL   Chol/HDL Ratio 3.7 0.0 - 4.4 ratio  TSH  Result Value Ref Range   TSH 1.630 0.450 - 4.500 uIU/mL  Urinalysis, Routine w reflex microscopic  Result Value Ref Range   Specific Gravity, UA 1.020 1.005 - 1.030   pH, UA 7.5 5.0 - 7.5   Color, UA Red (A) Yellow   Appearance Ur Cloudy (A) Clear   Leukocytes,UA 1+ (A) Negative   Protein,UA 1+ (A) Negative/Trace   Glucose, UA Negative Negative   Ketones, UA Negative Negative   RBC, UA 3+ (A) Negative   Bilirubin, UA Negative Negative   Urobilinogen, Ur 0.2 0.2 - 1.0 mg/dL   Nitrite, UA Negative Negative   Microscopic Examination See below:       Assessment & Plan:   Problem List Items Addressed This Visit   None Visit Diagnoses     Annual physical exam    -  Primary   Health maintenance reviewed during visit today.  Labs ordered.  Vaccines up to date.  Mammogram and PAP up to date.   Relevant Orders   CBC with Differential/Platelet (Completed)   Comprehensive metabolic panel (Completed)   Lipid panel (Completed)   TSH (Completed)   Urinalysis, Routine w reflex microscopic (Completed)   Elevated LDL cholesterol level       Relevant Orders   Lipid panel (Completed)         Follow up plan: Return in about 6 months (around 12/23/2023) for HTN, HLD, DM2 FU.   LABORATORY TESTING:  - Pap smear: up to date  IMMUNIZATIONS:   - Tdap: Tetanus vaccination status reviewed: last tetanus booster within 10 years. - Influenza:  Receives flu shot at work - Pneumovax: Not applicable - Prevnar: Not applicable - COVID: Not applicable - HPV: Not applicable - Shingrix vaccine: Not applicable  SCREENING: -Mammogram: Ordered today  - Colonoscopy: Not applicable  - Bone Density: Not applicable  -Hearing Test: Not applicable  -Spirometry: Not applicable   PATIENT COUNSELING:   Advised to  take 1 mg of folate supplement per day if capable of pregnancy.   Sexuality: Discussed sexually transmitted  diseases, partner selection, use of condoms, avoidance of unintended pregnancy  and contraceptive alternatives.   Advised to avoid cigarette smoking.  I discussed with the patient that most people either abstain from alcohol or drink within safe limits (<=14/week and <=4 drinks/occasion for males, <=7/weeks and <= 3 drinks/occasion for females) and that the risk for alcohol disorders and other health effects rises proportionally with the number of drinks per week and how often a drinker exceeds daily limits.  Discussed cessation/primary prevention of drug use and availability of treatment for abuse.   Diet: Encouraged to adjust caloric intake to maintain  or achieve ideal body weight, to reduce intake of dietary saturated fat and total fat, to limit sodium intake by avoiding high sodium foods and not adding table salt, and to maintain adequate dietary potassium and calcium preferably from fresh fruits, vegetables, and low-fat dairy products.    stressed the importance of regular exercise  Injury prevention: Discussed safety belts, safety helmets, smoke detector, smoking near bedding or upholstery.   Dental health: Discussed importance of regular tooth brushing, flossing, and dental visits.    NEXT PREVENTATIVE PHYSICAL DUE IN 1 YEAR. Return in about 6 months (around 12/23/2023) for HTN, HLD, DM2 FU.

## 2023-06-25 ENCOUNTER — Ambulatory Visit (INDEPENDENT_AMBULATORY_CARE_PROVIDER_SITE_OTHER): Payer: Federal, State, Local not specified - PPO | Admitting: Nurse Practitioner

## 2023-06-25 ENCOUNTER — Encounter: Payer: Self-pay | Admitting: Nurse Practitioner

## 2023-06-25 VITALS — BP 127/79 | HR 82 | Temp 98.4°F | Ht 60.0 in | Wt 200.6 lb

## 2023-06-25 DIAGNOSIS — R8290 Unspecified abnormal findings in urine: Secondary | ICD-10-CM

## 2023-06-25 DIAGNOSIS — R7309 Other abnormal glucose: Secondary | ICD-10-CM | POA: Diagnosis not present

## 2023-06-25 DIAGNOSIS — E7800 Pure hypercholesterolemia, unspecified: Secondary | ICD-10-CM

## 2023-06-25 DIAGNOSIS — Z0000 Encounter for general adult medical examination without abnormal findings: Secondary | ICD-10-CM

## 2023-06-25 DIAGNOSIS — M869 Osteomyelitis, unspecified: Secondary | ICD-10-CM | POA: Diagnosis not present

## 2023-06-25 LAB — URINALYSIS, ROUTINE W REFLEX MICROSCOPIC
Bilirubin, UA: NEGATIVE
Glucose, UA: NEGATIVE
Ketones, UA: NEGATIVE
Nitrite, UA: NEGATIVE
Specific Gravity, UA: 1.02 (ref 1.005–1.030)
Urobilinogen, Ur: 0.2 mg/dL (ref 0.2–1.0)
pH, UA: 7.5 (ref 5.0–7.5)

## 2023-06-25 LAB — MICROSCOPIC EXAMINATION: Bacteria, UA: NONE SEEN

## 2023-06-26 ENCOUNTER — Encounter: Payer: Self-pay | Admitting: Nurse Practitioner

## 2023-06-26 DIAGNOSIS — R8290 Unspecified abnormal findings in urine: Secondary | ICD-10-CM | POA: Diagnosis not present

## 2023-06-26 LAB — COMPREHENSIVE METABOLIC PANEL
ALT: 17 [IU]/L (ref 0–32)
AST: 18 [IU]/L (ref 0–40)
Albumin: 4.2 g/dL (ref 3.9–4.9)
Alkaline Phosphatase: 74 [IU]/L (ref 44–121)
BUN/Creatinine Ratio: 20 (ref 9–23)
BUN: 15 mg/dL (ref 6–24)
Bilirubin Total: 0.5 mg/dL (ref 0.0–1.2)
CO2: 24 mmol/L (ref 20–29)
Calcium: 9.2 mg/dL (ref 8.7–10.2)
Chloride: 101 mmol/L (ref 96–106)
Creatinine, Ser: 0.75 mg/dL (ref 0.57–1.00)
Globulin, Total: 2.6 g/dL (ref 1.5–4.5)
Glucose: 121 mg/dL — ABNORMAL HIGH (ref 70–99)
Potassium: 4 mmol/L (ref 3.5–5.2)
Sodium: 139 mmol/L (ref 134–144)
Total Protein: 6.8 g/dL (ref 6.0–8.5)
eGFR: 101 mL/min/{1.73_m2} (ref >59)

## 2023-06-26 LAB — LIPID PANEL
Chol/HDL Ratio: 3.7 ratio (ref 0.0–4.4)
Cholesterol, Total: 172 mg/dL (ref 100–199)
HDL: 46 mg/dL (ref >39)
LDL Chol Calc (NIH): 95 mg/dL (ref 0–99)
Triglycerides: 178 mg/dL — ABNORMAL HIGH (ref 0–149)
VLDL Cholesterol Cal: 31 mg/dL (ref 5–40)

## 2023-06-26 LAB — CBC WITH DIFFERENTIAL/PLATELET
Basophils Absolute: 0 10*3/uL (ref 0.0–0.2)
Basos: 0 %
EOS (ABSOLUTE): 0.1 10*3/uL (ref 0.0–0.4)
Eos: 2 %
Hematocrit: 39.8 % (ref 34.0–46.6)
Hemoglobin: 13.2 g/dL (ref 11.1–15.9)
Immature Grans (Abs): 0.1 10*3/uL (ref 0.0–0.1)
Immature Granulocytes: 1 %
Lymphocytes Absolute: 2.3 10*3/uL (ref 0.7–3.1)
Lymphs: 25 %
MCH: 29.5 pg (ref 26.6–33.0)
MCHC: 33.2 g/dL (ref 31.5–35.7)
MCV: 89 fL (ref 79–97)
Monocytes Absolute: 0.7 10*3/uL (ref 0.1–0.9)
Monocytes: 7 %
Neutrophils Absolute: 6 10*3/uL (ref 1.4–7.0)
Neutrophils: 65 %
Platelets: 247 10*3/uL (ref 150–450)
RBC: 4.47 x10E6/uL (ref 3.77–5.28)
RDW: 12.9 % (ref 11.7–15.4)
WBC: 9.2 10*3/uL (ref 3.4–10.8)

## 2023-06-26 LAB — TSH: TSH: 1.63 u[IU]/mL (ref 0.450–4.500)

## 2023-06-26 NOTE — Addendum Note (Signed)
Addended by: Larae Grooms on: 06/26/2023 11:49 AM   Modules accepted: Orders

## 2023-06-28 LAB — URINE CULTURE

## 2023-06-29 LAB — HEMOGLOBIN A1C
Est. average glucose Bld gHb Est-mCnc: 117 mg/dL
Hgb A1c MFr Bld: 5.7 % — ABNORMAL HIGH (ref 4.8–5.6)

## 2023-06-29 LAB — SPECIMEN STATUS REPORT

## 2023-07-07 ENCOUNTER — Ambulatory Visit: Payer: Federal, State, Local not specified - PPO | Admitting: Nurse Practitioner

## 2023-09-12 ENCOUNTER — Encounter: Payer: Self-pay | Admitting: Emergency Medicine

## 2023-09-12 ENCOUNTER — Ambulatory Visit
Admission: EM | Admit: 2023-09-12 | Discharge: 2023-09-12 | Disposition: A | Discharge status: Home / Self Care | Payer: Federal, State, Local not specified - PPO | Attending: Emergency Medicine | Admitting: Emergency Medicine

## 2023-09-12 DIAGNOSIS — J069 Acute upper respiratory infection, unspecified: Secondary | ICD-10-CM

## 2023-09-12 MED ORDER — IPRATROPIUM BROMIDE 0.06 % NA SOLN: 2.0000 | Freq: Four times a day (QID) | NASAL | 12 refills | Status: AC

## 2023-09-12 MED ORDER — BENZONATATE 100 MG PO CAPS: 200.0000 mg | ORAL_CAPSULE | Freq: Three times a day (TID) | ORAL | 0 refills | Status: DC

## 2023-09-12 MED ORDER — PROMETHAZINE-DM 6.25-15 MG/5ML PO SYRP: 5.0000 mL | ORAL_SOLUTION | Freq: Four times a day (QID) | ORAL | 0 refills | Status: DC | PRN

## 2023-09-12 MED ORDER — AMOXICILLIN-POT CLAVULANATE 875-125 MG PO TABS: 1.0000 | ORAL_TABLET | Freq: Two times a day (BID) | ORAL | 0 refills | Status: AC
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
## 2023-09-12 NOTE — Discharge Instructions (Addendum)
Take the Augmentin 875 mg twice daily with food for 10 days for treatment of your respiratory infection.  Use over-the-counter Tylenol and/or ibuprofen according to package directions as needed for any fever or pain.  Use the Atrovent nasal spray, 2 squirts in each nostril every 6 hours, as needed for runny nose and postnasal drip.  Use the Tessalon Perles every 8 hours during the day.  Take them with a small sip of water.  They may give you some numbness to the base of your tongue or a metallic taste in your mouth, this is normal.  Use the Promethazine DM cough syrup at bedtime for cough and congestion.  It will make you drowsy so do not take it during the day.  Return for reevaluation or see your primary care provider for any new or worsening symptoms.

## 2023-09-12 NOTE — ED Triage Notes (Signed)
Pt c/o cough, nasal congestion, shortness of breath, rash (on neck and arms). She states when she goes to work she coughs worse and she feels dizzy. She states she had the flu during christmas. No recent fever.

## 2023-11-12 ENCOUNTER — Ambulatory Visit: Payer: Self-pay
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
## 2023-11-16 ENCOUNTER — Ambulatory Visit: Admitting: Family Medicine

## 2023-11-16 VITALS — BP 129/85 | HR 76 | Temp 98.2°F | Ht 60.0 in | Wt 204.4 lb

## 2023-11-16 DIAGNOSIS — M47812 Spondylosis without myelopathy or radiculopathy, cervical region: Secondary | ICD-10-CM | POA: Diagnosis not present

## 2023-11-16 MED ORDER — NAPROXEN 500 MG PO TABS: 500.0000 mg | ORAL_TABLET | Freq: Two times a day (BID) | ORAL | 0 refills | Status: AC

## 2023-11-16 MED ORDER — CYCLOBENZAPRINE HCL 5 MG PO TABS: 2.5000 mg | ORAL_TABLET | Freq: Every day | ORAL | 0 refills | Status: AC
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
## 2023-11-16 NOTE — Patient Instructions (Signed)
 [facility]  [address] [place], [place], [place] [address] Phone: [phone]

## 2023-11-16 NOTE — Assessment & Plan Note (Signed)
 Will get her set up with PT and start her on naproxen and flexeril. Call with any concerns.

## 2023-11-26 ENCOUNTER — Encounter: Payer: Self-pay | Admitting: Nurse Practitioner

## 2023-11-30 ENCOUNTER — Encounter: Payer: Self-pay | Admitting: Family Medicine

## 2023-11-30 NOTE — Telephone Encounter (Signed)
 Patient scheduled for 12/02/2023 @ 11:20.

## 2023-11-30 NOTE — Telephone Encounter (Signed)
 Can we get her booked with Mariah Shines- she has an opening tomorrow

## 2023-12-02 ENCOUNTER — Telehealth: Admitting: Nurse Practitioner

## 2023-12-02 ENCOUNTER — Encounter: Payer: Self-pay | Admitting: Nurse Practitioner

## 2023-12-02 DIAGNOSIS — M47812 Spondylosis without myelopathy or radiculopathy, cervical region: Secondary | ICD-10-CM | POA: Diagnosis not present
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
## 2023-12-02 NOTE — Assessment & Plan Note (Signed)
 Ongoing pain. Will refer patient to Orthopedics.  She starts physical therapy on 4/30.  Letter written for accomodation at work.

## 2023-12-07 ENCOUNTER — Ambulatory Visit: Admitting: Nurse Practitioner
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
## 2023-12-16 ENCOUNTER — Ambulatory Visit: Attending: Family Medicine

## 2023-12-16 DIAGNOSIS — M25511 Pain in right shoulder: Secondary | ICD-10-CM | POA: Diagnosis not present

## 2023-12-16 DIAGNOSIS — M542 Cervicalgia: Secondary | ICD-10-CM | POA: Insufficient documentation

## 2023-12-16 DIAGNOSIS — M6281 Muscle weakness (generalized): Secondary | ICD-10-CM | POA: Diagnosis not present

## 2023-12-16 DIAGNOSIS — G8929 Other chronic pain: Secondary | ICD-10-CM | POA: Diagnosis not present

## 2023-12-16 DIAGNOSIS — M47812 Spondylosis without myelopathy or radiculopathy, cervical region: Secondary | ICD-10-CM | POA: Insufficient documentation

## 2023-12-19 MED ORDER — PROPOFOL 10 MG/ML IV BOLUS
INTRAVENOUS | Status: AC
  Filled 2023-12-19: qty 20

## 2023-12-21 ENCOUNTER — Ambulatory Visit

## 2023-12-23 ENCOUNTER — Ambulatory Visit: Payer: Self-pay | Admitting: Nurse Practitioner

## 2023-12-24 ENCOUNTER — Ambulatory Visit

## 2023-12-28 ENCOUNTER — Ambulatory Visit

## 2023-12-30 ENCOUNTER — Ambulatory Visit
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
## 2024-01-04 ENCOUNTER — Telehealth: Payer: Self-pay

## 2024-01-04 ENCOUNTER — Ambulatory Visit: Attending: Family Medicine

## 2024-01-04 NOTE — Telephone Encounter (Signed)
 Called patient about missed appointment. No answer but LVM regarding next following appointment. Advised of no show policy and to call for future rescheduling or concerns.

## 2024-01-06 ENCOUNTER — Ambulatory Visit

## 2024-01-12 ENCOUNTER — Ambulatory Visit

## 2024-01-13 ENCOUNTER — Encounter: Payer: Self-pay | Admitting: Nurse Practitioner

## 2024-01-13 ENCOUNTER — Ambulatory Visit: Admitting: Nurse Practitioner

## 2024-01-13 VITALS — BP 125/76 | HR 91 | Temp 98.5°F | Resp 15 | Ht 60.0 in | Wt 205.8 lb

## 2024-01-13 DIAGNOSIS — M5412 Radiculopathy, cervical region: Secondary | ICD-10-CM

## 2024-01-13 DIAGNOSIS — F339 Major depressive disorder, recurrent, unspecified: Secondary | ICD-10-CM | POA: Diagnosis not present

## 2024-01-13 DIAGNOSIS — F410 Panic disorder [episodic paroxysmal anxiety] without agoraphobia: Secondary | ICD-10-CM | POA: Diagnosis not present

## 2024-01-13 NOTE — Assessment & Plan Note (Signed)
 Chronic.  Exacerbated at this time. Does not want to take medication.  Denies SI.  Follow up in 6 months. Call sooner if concerns arise.

## 2024-01-13 NOTE — Progress Notes (Signed)
 BP 125/76 (BP Location: Left Arm, Patient Position: Sitting, Cuff Size: Large)   Pulse 91   Temp 98.5 F (36.9 C) (Oral)   Resp 15   Ht 5' (1.524 m)   Wt 205 lb 12.8 oz (93.4 kg)   LMP 01/08/2024 (Approximate)   SpO2 99%   BMI 40.19 kg/m    Subjective:    Patient ID: Stacy Yang, female    DOB: Jun 08, 1981, 43 y.o.   MRN: [id number]  HPI: Stacy Yang is a 43 y.o. female  Chief Complaint  Patient presents with   Neck Pain    Not getting any better. Is worse at this point. Too much repetitive at work.    Follow-up   Mood    Neck pain is making this worse. Many different stressors. Nightmares restart when she is under a lot of stress, also the grief.    MOOD Has been a little bad due to her daughter having a c section.  Her mind has been worrying constantly about her daughter.  She is constantly worrying about her daughter and the baby. She is having dreams and this happens when she is worrying more.  She feels like she will get through it in a couple of days. Denies SI.   NECK PAIN Patient states she is still having neck pain.  She is going to start back doing physical therapy.  Life got in the way and she had to cancel some of her appointments.  She has been doing exercises and stretches at home.  She has been working with a doctor who has a lighter schedule.  This sometimes happens and sometimes doesn't. When she turns her head to the left it is excruciating pain.   Relevant past medical, surgical, family and social history reviewed and updated as indicated. Interim medical history since our last visit reviewed. Allergies and medications reviewed and updated.  Review of Systems  Musculoskeletal:  Positive for neck pain.  Psychiatric/Behavioral:  Positive for dysphoric mood. Negative for suicidal ideas. The patient is nervous/anxious.     Per HPI unless specifically indicated above     Objective:     BP 125/76 (BP Location: Left Arm, Patient Position:  Sitting, Cuff Size: Large)   Pulse 91   Temp 98.5 F (36.9 C) (Oral)   Resp 15   Ht 5' (1.524 m)   Wt 205 lb 12.8 oz (93.4 kg)   LMP 01/08/2024 (Approximate)   SpO2 99%   BMI 40.19 kg/m   Wt Readings from Last 3 Encounters:  01/13/24 205 lb 12.8 oz (93.4 kg)  11/16/23 204 lb 6.4 oz (92.7 kg)  09/12/23 200 lb 9.9 oz (91 kg)    Physical Exam Vitals and nursing note reviewed.  Constitutional:      General: She is not in acute distress.    Appearance: Normal appearance. She is normal weight. She is not ill-appearing, toxic-appearing or diaphoretic.  HENT:     Head: Normocephalic.     Right Ear: External ear normal.     Left Ear: External ear normal.     Nose: Nose normal.     Mouth/Throat:     Mouth: Mucous membranes are moist.     Pharynx: Oropharynx is clear.  Eyes:     General:        Right eye: No discharge.        Left eye: No discharge.     Extraocular Movements: Extraocular movements intact.     Conjunctiva/sclera:  Conjunctivae normal.     Pupils: Pupils are equal, round, and reactive to light.  Cardiovascular:     Rate and Rhythm: Normal rate and regular rhythm.     Heart sounds: No murmur heard. Pulmonary:     Effort: Pulmonary effort is normal. No respiratory distress.     Breath sounds: Normal breath sounds. No wheezing or rales.  Musculoskeletal:     Cervical back: Normal range of motion and neck supple.  Skin:    General: Skin is warm and dry.     Capillary Refill: Capillary refill takes less than 2 seconds.  Neurological:     General: No focal deficit present.     Mental Status: She is alert and oriented to person, place, and time. Mental status is at baseline.  Psychiatric:        Mood and Affect: Mood normal. Affect is tearful.        Behavior: Behavior normal.        Thought Content: Thought content normal.        Judgment: Judgment normal.     Results for orders placed or performed in visit on 06/25/23  Microscopic Examination   Collection  Time: 06/25/23  3:51 PM   Urine  Result Value Ref Range   WBC, UA 0-5 0 - 5 /hpf   RBC, Urine >30R 0 - 2 /hpf   Epithelial Cells (non renal) 0-10 0 - 10 /hpf   Bacteria, UA None seen None seen/Few  Urinalysis, Routine w reflex microscopic   Collection Time: 06/25/23  3:51 PM  Result Value Ref Range   Specific Gravity, UA 1.020 1.005 - 1.030   pH, UA 7.5 5.0 - 7.5   Color, UA Red (A) Yellow   Appearance Ur Cloudy (A) Clear   Leukocytes,UA 1+ (A) Negative   Protein,UA 1+ (A) Negative/Trace   Glucose, UA Negative Negative   Ketones, UA Negative Negative   RBC, UA 3+ (A) Negative   Bilirubin, UA Negative Negative   Urobilinogen, Ur 0.2 0.2 - 1.0 mg/dL   Nitrite, UA Negative Negative   Microscopic Examination See below:   CBC with Differential/Platelet   Collection Time: 06/25/23  3:56 PM  Result Value Ref Range   WBC 9.2 3.4 - 10.8 x10E3/uL   RBC 4.47 3.77 - 5.28 x10E6/uL   Hemoglobin 13.2 11.1 - 15.9 g/dL   Hematocrit 04.5 40.9 - 46.6 %   MCV 89 79 - 97 fL   MCH 29.5 26.6 - 33.0 pg   MCHC 33.2 31.5 - 35.7 g/dL   RDW 81.1 91.4 - 78.2 %   Platelets 247 150 - 450 x10E3/uL   Neutrophils 65 Not Estab. %   Lymphs 25 Not Estab. %   Monocytes 7 Not Estab. %   Eos 2 Not Estab. %   Basos 0 Not Estab. %   Neutrophils Absolute 6.0 1.4 - 7.0 x10E3/uL   Lymphocytes Absolute 2.3 0.7 - 3.1 x10E3/uL   Monocytes Absolute 0.7 0.1 - 0.9 x10E3/uL   EOS (ABSOLUTE) 0.1 0.0 - 0.4 x10E3/uL   Basophils Absolute 0.0 0.0 - 0.2 x10E3/uL   Immature Granulocytes 1 Not Estab. %   Immature Grans (Abs) 0.1 0.0 - 0.1 x10E3/uL  Comprehensive metabolic panel   Collection Time: 06/25/23  3:56 PM  Result Value Ref Range   Glucose 121 (H) 70 - 99 mg/dL   BUN 15 6 - 24 mg/dL   Creatinine, Ser 9.56 0.57 - 1.00 mg/dL   eGFR 213 >08 MV/HQI/6.96  BUN/Creatinine Ratio 20 9 - 23   Sodium 139 134 - 144 mmol/L   Potassium 4.0 3.5 - 5.2 mmol/L   Chloride 101 96 - 106 mmol/L   CO2 24 20 - 29 mmol/L   Calcium  9.2 8.7 - 10.2 mg/dL   Total Protein 6.8 6.0 - 8.5 g/dL   Albumin 4.2 3.9 - 4.9 g/dL   Globulin, Total 2.6 1.5 - 4.5 g/dL   Bilirubin Total 0.5 0.0 - 1.2 mg/dL   Alkaline Phosphatase 74 44 - 121 IU/L   AST 18 0 - 40 IU/L   ALT 17 0 - 32 IU/L  Lipid panel   Collection Time: 06/25/23  3:56 PM  Result Value Ref Range   Cholesterol, Total 172 100 - 199 mg/dL   Triglycerides 409 (H) 0 - 149 mg/dL   HDL 46 >81 mg/dL   VLDL Cholesterol Cal 31 5 - 40 mg/dL   LDL Chol Calc (NIH) 95 0 - 99 mg/dL   Chol/HDL Ratio 3.7 0.0 - 4.4 ratio  TSH   Collection Time: 06/25/23  3:56 PM  Result Value Ref Range   TSH 1.630 0.450 - 4.500 uIU/mL  Hemoglobin A1c   Collection Time: 06/25/23  3:56 PM  Result Value Ref Range   Hgb A1c MFr Bld 5.7 (H) 4.8 - 5.6 %   Est. average glucose Bld gHb Est-mCnc 117 mg/dL  Specimen status report   Collection Time: 06/25/23  3:56 PM  Result Value Ref Range   specimen status report Comment   Urine Culture   Collection Time: 06/26/23 11:52 AM   Specimen: Urine   UR  Result Value Ref Range   Urine Culture, Routine Final report    Organism ID, Bacteria Comment       Assessment & Plan:   Problem List Items Addressed This Visit       Nervous and Auditory   Cervical radiculopathy   Chronic.  Ongoing symptoms.  Followed by [facility].   Is restarting PT.  Continue with exercises at home.        Other   Depression, recurrent (HCC) - Primary   Chronic.  Exacerbated at this time. Does not want to take medication.  Denies SI.  Follow up in 6 months. Call sooner if concerns arise.       Panic attack   Chronic.  Exacerbated at this time. Does not want to take medication.  Denies SI.  Follow up in 6 months. Call sooner if concerns arise.         Follow up plan: Return in about 6 months (around 07/15/2024) for Physical and Fasting labs.

## 2024-01-13 NOTE — Assessment & Plan Note (Signed)
 Chronic.  Ongoing symptoms.  Followed by [facility].   Is restarting PT.  Continue with exercises at home.

## 2024-01-19 ENCOUNTER — Encounter

## 2024-01-21 ENCOUNTER — Encounter

## 2024-01-26 ENCOUNTER — Encounter

## 2024-01-28 ENCOUNTER — Encounter

## 2024-02-02 ENCOUNTER — Encounter

## 2024-02-04 ENCOUNTER — Encounter

## 2024-02-08 ENCOUNTER — Encounter

## 2024-02-10 ENCOUNTER — Encounter

## 2024-02-15 ENCOUNTER — Encounter

## 2024-04-19 ENCOUNTER — Ambulatory Visit: Admitting: Nurse Practitioner

## 2024-05-02 ENCOUNTER — Ambulatory Visit: Admitting: Nurse Practitioner

## 2024-05-09 ENCOUNTER — Telehealth: Admitting: Physician Assistant

## 2024-05-09 DIAGNOSIS — K648 Other hemorrhoids: Secondary | ICD-10-CM

## 2024-05-10 NOTE — Progress Notes (Signed)
  Because of how long this has been going on, with continued symptoms despite multiple treatments, I feel your condition warrants further evaluation and I recommend that you be seen in a face-to-face visit.   NOTE: There will be NO CHARGE for this E-Visit   If you are having a true medical emergency, please call 911.     For an urgent face to face visit, [facility] has multiple urgent care centers for your convenience.  Click the link below for the full list of locations and hours, walk-in wait times, appointment scheduling options and driving directions:  Urgent Care - [place], [place], [place], [place], [place], [place]  [place]     Your MyChart E-visit questionnaire answers were reviewed by a board certified advanced clinical practitioner to complete your personal care plan based on your specific symptoms.    Thank you for using e-Visits.

## 2024-05-16 ENCOUNTER — Ambulatory Visit: Admitting: Nurse Practitioner

## 2024-05-16 VITALS — BP 126/88 | HR 92 | Wt 207.0 lb

## 2024-05-16 DIAGNOSIS — K649 Unspecified hemorrhoids: Secondary | ICD-10-CM

## 2024-05-16 DIAGNOSIS — K625 Hemorrhage of anus and rectum: Secondary | ICD-10-CM | POA: Diagnosis not present

## 2024-05-16 NOTE — Assessment & Plan Note (Signed)
 Referral to general surgery. Follow-up as needed.

## 2024-05-16 NOTE — Progress Notes (Signed)
 BP 126/88   Pulse 92   Wt 207 lb (93.9 kg)   SpO2 99%   BMI 40.43 kg/m    Subjective:    Patient ID: Stacy Yang, female    DOB: Mar 28, 1981, 43 y.o.   MRN: [id number]     Chief Complaint  Patient presents with  . Rectal Pain    Been ongoing for about 2 months ago    HPI: Stacy Yang is a 43 y.o. female has a history of hemorrhoids and fissures, pt has been struggling with her current flare up for the past couple months but states it has been worsening. Pt notices daily pain and bleeding with one episode of clots. Pt has pain with bowel movements and gas. Pt has used home hemorrhoid creams and OTC treatments, sitz baths, stool softeners without relief. Pt reports bowel movements are regular once every 1-2 days. Pt reports stools intermittently hard, but mostly soft formed stool. Pt denies diarrhea or watery stools.   Relevant past medical, surgical, family and social history reviewed and updated as indicated. Interim medical history since our last visit reviewed. Allergies and medications reviewed and updated.  Review of Systems  Constitutional:  Negative for chills, fatigue and fever.  HENT:  Negative for rhinorrhea.   Respiratory:  Negative for chest tightness and shortness of breath.   Cardiovascular:  Negative for chest pain and palpitations.  Gastrointestinal:  Positive for anal bleeding and rectal pain. Negative for abdominal pain, constipation and diarrhea.  Genitourinary:  Negative for dysuria and vaginal discharge.  Neurological:  Negative for weakness and light-headedness.    Per HPI unless specifically indicated above     Objective:    BP 126/88   Pulse 92   Wt 207 lb (93.9 kg)   SpO2 99%   BMI 40.43 kg/m   Wt Readings from Last 3 Encounters:  05/16/24 207 lb (93.9 kg)  01/13/24 205 lb 12.8 oz (93.4 kg)  11/16/23 204 lb 6.4 oz (92.7 kg)    Physical Exam Vitals and nursing note reviewed.  Constitutional:      General: She is not in acute  distress.    Appearance: Normal appearance. She is not ill-appearing, toxic-appearing or diaphoretic.  HENT:     Head: Normocephalic.     Nose: Nose normal.     Mouth/Throat:     Mouth: Mucous membranes are moist.     Pharynx: No oropharyngeal exudate or posterior oropharyngeal erythema.  Eyes:     Conjunctiva/sclera: Conjunctivae normal.  Cardiovascular:     Rate and Rhythm: Normal rate and regular rhythm.     Pulses: Normal pulses.     Heart sounds: Normal heart sounds. No murmur heard. Pulmonary:     Effort: Pulmonary effort is normal. No respiratory distress.     Breath sounds: Normal breath sounds. No wheezing, rhonchi or rales.  Abdominal:     General: Abdomen is flat. Bowel sounds are normal. There is no distension.     Palpations: Abdomen is soft.     Tenderness: There is no abdominal tenderness. There is no guarding or rebound.  Skin:    General: Skin is warm and dry.     Capillary Refill: Capillary refill takes less than 2 seconds.  Neurological:     General: No focal deficit present.     Mental Status: She is alert and oriented to person, place, and time.  Psychiatric:        Mood and Affect: Mood normal.  Behavior: Behavior normal.        Thought Content: Thought content normal.        Judgment: Judgment normal.     Results for orders placed or performed in visit on 06/25/23  Microscopic Examination   Collection Time: 06/25/23  3:51 PM   Urine  Result Value Ref Range   WBC, UA 0-5 0 - 5 /hpf   RBC, Urine >30R 0 - 2 /hpf   Epithelial Cells (non renal) 0-10 0 - 10 /hpf   Bacteria, UA None seen None seen/Few  Urinalysis, Routine w reflex microscopic   Collection Time: 06/25/23  3:51 PM  Result Value Ref Range   Specific Gravity, UA 1.020 1.005 - 1.030   pH, UA 7.5 5.0 - 7.5   Color, UA Red (A) Yellow   Appearance Ur Cloudy (A) Clear   Leukocytes,UA 1+ (A) Negative   Protein,UA 1+ (A) Negative/Trace   Glucose, UA Negative Negative   Ketones, UA  Negative Negative   RBC, UA 3+ (A) Negative   Bilirubin, UA Negative Negative   Urobilinogen, Ur 0.2 0.2 - 1.0 mg/dL   Nitrite, UA Negative Negative   Microscopic Examination See below:   CBC with Differential/Platelet   Collection Time: 06/25/23  3:56 PM  Result Value Ref Range   WBC 9.2 3.4 - 10.8 x10E3/uL   RBC 4.47 3.77 - 5.28 x10E6/uL   Hemoglobin 13.2 11.1 - 15.9 g/dL   Hematocrit 60.1 65.9 - 46.6 %   MCV 89 79 - 97 fL   MCH 29.5 26.6 - 33.0 pg   MCHC 33.2 31.5 - 35.7 g/dL   RDW 87.0 88.2 - 84.5 %   Platelets 247 150 - 450 x10E3/uL   Neutrophils 65 Not Estab. %   Lymphs 25 Not Estab. %   Monocytes 7 Not Estab. %   Eos 2 Not Estab. %   Basos 0 Not Estab. %   Neutrophils Absolute 6.0 1.4 - 7.0 x10E3/uL   Lymphocytes Absolute 2.3 0.7 - 3.1 x10E3/uL   Monocytes Absolute 0.7 0.1 - 0.9 x10E3/uL   EOS (ABSOLUTE) 0.1 0.0 - 0.4 x10E3/uL   Basophils Absolute 0.0 0.0 - 0.2 x10E3/uL   Immature Granulocytes 1 Not Estab. %   Immature Grans (Abs) 0.1 0.0 - 0.1 x10E3/uL  Comprehensive metabolic panel   Collection Time: 06/25/23  3:56 PM  Result Value Ref Range   Glucose 121 (H) 70 - 99 mg/dL   BUN 15 6 - 24 mg/dL   Creatinine, Ser 9.24 0.57 - 1.00 mg/dL   eGFR 898 >40 fO/fpw/8.26   BUN/Creatinine Ratio 20 9 - 23   Sodium 139 134 - 144 mmol/L   Potassium 4.0 3.5 - 5.2 mmol/L   Chloride 101 96 - 106 mmol/L   CO2 24 20 - 29 mmol/L   Calcium 9.2 8.7 - 10.2 mg/dL   Total Protein 6.8 6.0 - 8.5 g/dL   Albumin 4.2 3.9 - 4.9 g/dL   Globulin, Total 2.6 1.5 - 4.5 g/dL   Bilirubin Total 0.5 0.0 - 1.2 mg/dL   Alkaline Phosphatase 74 44 - 121 IU/L   AST 18 0 - 40 IU/L   ALT 17 0 - 32 IU/L  Lipid panel   Collection Time: 06/25/23  3:56 PM  Result Value Ref Range   Cholesterol, Total 172 100 - 199 mg/dL   Triglycerides 821 (H) 0 - 149 mg/dL   HDL 46 >60 mg/dL   VLDL Cholesterol Cal 31 5 - 40 mg/dL  LDL Chol Calc (NIH) 95 0 - 99 mg/dL   Chol/HDL Ratio 3.7 0.0 - 4.4 ratio  TSH    Collection Time: 06/25/23  3:56 PM  Result Value Ref Range   TSH 1.630 0.450 - 4.500 uIU/mL  Hemoglobin A1c   Collection Time: 06/25/23  3:56 PM  Result Value Ref Range   Hgb A1c MFr Bld 5.7 (H) 4.8 - 5.6 %   Est. average glucose Bld gHb Est-mCnc 117 mg/dL  Specimen status report   Collection Time: 06/25/23  3:56 PM  Result Value Ref Range   specimen status report Comment   Urine Culture   Collection Time: 06/26/23 11:52 AM   Specimen: Urine   UR  Result Value Ref Range   Urine Culture, Routine Final report    Organism ID, Bacteria Comment       Assessment & Plan:   Problem List Items Addressed This Visit       Digestive   Rectal bleeding   Referral to general surgery. Follow-up as needed.      Other Visit Diagnoses       Hemorrhoids, unspecified hemorrhoid type    -  Primary   Appear to be internal. Having some difficulty passing stool at times.   Relevant Orders   Ambulatory referral to General Surgery        Follow up plan: No follow-ups on file.

## 2024-05-19 ENCOUNTER — Ambulatory Visit: Admitting: Nurse Practitioner

## 2024-05-31 ENCOUNTER — Ambulatory Visit: Admitting: General Surgery

## 2024-05-31 ENCOUNTER — Encounter: Payer: Self-pay | Admitting: Nurse Practitioner

## 2024-05-31 ENCOUNTER — Encounter: Payer: Self-pay | Admitting: General Surgery

## 2024-05-31 ENCOUNTER — Ambulatory Visit: Admitting: Nurse Practitioner

## 2024-05-31 VITALS — BP 95/66 | HR 81 | Temp 98.4°F | Ht 60.0 in | Wt 203.2 lb

## 2024-05-31 VITALS — BP 122/81 | HR 77 | Temp 98.1°F | Ht 60.0 in | Wt 202.4 lb

## 2024-05-31 DIAGNOSIS — M47812 Spondylosis without myelopathy or radiculopathy, cervical region: Secondary | ICD-10-CM | POA: Diagnosis not present

## 2024-05-31 DIAGNOSIS — Z0289 Encounter for other administrative examinations: Secondary | ICD-10-CM | POA: Diagnosis not present

## 2024-05-31 DIAGNOSIS — K640 First degree hemorrhoids: Secondary | ICD-10-CM

## 2024-05-31 DIAGNOSIS — F4310 Post-traumatic stress disorder, unspecified: Secondary | ICD-10-CM

## 2024-05-31 NOTE — Progress Notes (Signed)
 BP 95/66   Pulse 81   Temp 98.4 F (36.9 C) (Oral)   Ht 5' (1.524 m)   Wt 203 lb 3.2 oz (92.2 kg)   SpO2 96%   BMI 39.68 kg/m    Subjective:    Patient ID: Stacy Yang, female    DOB: 27-Jun-1981, 43 y.o.   MRN: [id number]  HPI: Stacy Yang is a 43 y.o. female  Chief Complaint  Patient presents with   FMLA Paperwork   MOOD Has been a little bad due to her daughter having a c section.  Her mind has been worrying constantly about her daughter.  She is constantly worrying about her daughter and the baby. She is having dreams and this happens when she is worrying more.  She feels like she will get through it in a couple of days. Denies SI. She needs updated FMLA for her grief and mood.  NECK PAIN Patient states she is still having neck pain.  She is going to start back doing physical therapy.  Life got in the way and she had to cancel some of her appointments.  She has been doing exercises and stretches at home.  She has been working with a doctor who has a lighter schedule.  This sometimes happens and sometimes doesn't. When she turns her head to the left it is excruciating pain.  Pain is stable.  She does work with a provider who is less busy and that has helped with her pain.   Relevant past medical, surgical, family and social history reviewed and updated as indicated. Interim medical history since our last visit reviewed. Allergies and medications reviewed and updated.  Review of Systems  Musculoskeletal:  Positive for neck pain.  Psychiatric/Behavioral:  Positive for dysphoric mood. Negative for suicidal ideas. The patient is nervous/anxious.     Per HPI unless specifically indicated above     Objective:     BP 95/66   Pulse 81   Temp 98.4 F (36.9 C) (Oral)   Ht 5' (1.524 m)   Wt 203 lb 3.2 oz (92.2 kg)   SpO2 96%   BMI 39.68 kg/m   Wt Readings from Last 3 Encounters:  05/31/24 203 lb 3.2 oz (92.2 kg)  05/31/24 202 lb 6.4 oz (91.8 kg)  05/16/24 207  lb (93.9 kg)    Physical Exam Vitals and nursing note reviewed.  Constitutional:      General: She is not in acute distress.    Appearance: Normal appearance. She is not ill-appearing, toxic-appearing or diaphoretic.  HENT:     Head: Normocephalic.     Right Ear: External ear normal.     Left Ear: External ear normal.     Nose: Nose normal.     Mouth/Throat:     Mouth: Mucous membranes are moist.     Pharynx: Oropharynx is clear.  Eyes:     General:        Right eye: No discharge.        Left eye: No discharge.     Extraocular Movements: Extraocular movements intact.     Conjunctiva/sclera: Conjunctivae normal.     Pupils: Pupils are equal, round, and reactive to light.  Cardiovascular:     Rate and Rhythm: Normal rate and regular rhythm.     Heart sounds: No murmur heard. Pulmonary:     Effort: Pulmonary effort is normal. No respiratory distress.     Breath sounds: Normal breath sounds. No wheezing or rales.  Musculoskeletal:     Cervical back: Normal range of motion and neck supple.  Skin:    General: Skin is warm and dry.     Capillary Refill: Capillary refill takes less than 2 seconds.  Neurological:     General: No focal deficit present.     Mental Status: She is alert and oriented to person, place, and time. Mental status is at baseline.  Psychiatric:        Mood and Affect: Mood normal. Affect is tearful.        Behavior: Behavior normal.        Thought Content: Thought content normal.        Judgment: Judgment normal.     Results for orders placed or performed in visit on 06/25/23  Microscopic Examination   Collection Time: 06/25/23  3:51 PM   Urine  Result Value Ref Range   WBC, UA 0-5 0 - 5 /hpf   RBC, Urine >30R 0 - 2 /hpf   Epithelial Cells (non renal) 0-10 0 - 10 /hpf   Bacteria, UA None seen None seen/Few  Urinalysis, Routine w reflex microscopic   Collection Time: 06/25/23  3:51 PM  Result Value Ref Range   Specific Gravity, UA 1.020 1.005 -  1.030   pH, UA 7.5 5.0 - 7.5   Color, UA Red (A) Yellow   Appearance Ur Cloudy (A) Clear   Leukocytes,UA 1+ (A) Negative   Protein,UA 1+ (A) Negative/Trace   Glucose, UA Negative Negative   Ketones, UA Negative Negative   RBC, UA 3+ (A) Negative   Bilirubin, UA Negative Negative   Urobilinogen, Ur 0.2 0.2 - 1.0 mg/dL   Nitrite, UA Negative Negative   Microscopic Examination See below:   CBC with Differential/Platelet   Collection Time: 06/25/23  3:56 PM  Result Value Ref Range   WBC 9.2 3.4 - 10.8 x10E3/uL   RBC 4.47 3.77 - 5.28 x10E6/uL   Hemoglobin 13.2 11.1 - 15.9 g/dL   Hematocrit 60.1 65.9 - 46.6 %   MCV 89 79 - 97 fL   MCH 29.5 26.6 - 33.0 pg   MCHC 33.2 31.5 - 35.7 g/dL   RDW 87.0 88.2 - 84.5 %   Platelets 247 150 - 450 x10E3/uL   Neutrophils 65 Not Estab. %   Lymphs 25 Not Estab. %   Monocytes 7 Not Estab. %   Eos 2 Not Estab. %   Basos 0 Not Estab. %   Neutrophils Absolute 6.0 1.4 - 7.0 x10E3/uL   Lymphocytes Absolute 2.3 0.7 - 3.1 x10E3/uL   Monocytes Absolute 0.7 0.1 - 0.9 x10E3/uL   EOS (ABSOLUTE) 0.1 0.0 - 0.4 x10E3/uL   Basophils Absolute 0.0 0.0 - 0.2 x10E3/uL   Immature Granulocytes 1 Not Estab. %   Immature Grans (Abs) 0.1 0.0 - 0.1 x10E3/uL  Comprehensive metabolic panel   Collection Time: 06/25/23  3:56 PM  Result Value Ref Range   Glucose 121 (H) 70 - 99 mg/dL   BUN 15 6 - 24 mg/dL   Creatinine, Ser 9.24 0.57 - 1.00 mg/dL   eGFR 898 >40 fO/fpw/8.26   BUN/Creatinine Ratio 20 9 - 23   Sodium 139 134 - 144 mmol/L   Potassium 4.0 3.5 - 5.2 mmol/L   Chloride 101 96 - 106 mmol/L   CO2 24 20 - 29 mmol/L   Calcium 9.2 8.7 - 10.2 mg/dL   Total Protein 6.8 6.0 - 8.5 g/dL   Albumin 4.2 3.9 - 4.9 g/dL  Globulin, Total 2.6 1.5 - 4.5 g/dL   Bilirubin Total 0.5 0.0 - 1.2 mg/dL   Alkaline Phosphatase 74 44 - 121 IU/L   AST 18 0 - 40 IU/L   ALT 17 0 - 32 IU/L  Lipid panel   Collection Time: 06/25/23  3:56 PM  Result Value Ref Range   Cholesterol, Total  172 100 - 199 mg/dL   Triglycerides 821 (H) 0 - 149 mg/dL   HDL 46 >60 mg/dL   VLDL Cholesterol Cal 31 5 - 40 mg/dL   LDL Chol Calc (NIH) 95 0 - 99 mg/dL   Chol/HDL Ratio 3.7 0.0 - 4.4 ratio  TSH   Collection Time: 06/25/23  3:56 PM  Result Value Ref Range   TSH 1.630 0.450 - 4.500 uIU/mL  Hemoglobin A1c   Collection Time: 06/25/23  3:56 PM  Result Value Ref Range   Hgb A1c MFr Bld 5.7 (H) 4.8 - 5.6 %   Est. average glucose Bld gHb Est-mCnc 117 mg/dL  Specimen status report   Collection Time: 06/25/23  3:56 PM  Result Value Ref Range   specimen status report Comment   Urine Culture   Collection Time: 06/26/23 11:52 AM   Specimen: Urine   UR  Result Value Ref Range   Urine Culture, Routine Final report    Organism ID, Bacteria Comment       Assessment & Plan:   Problem List Items Addressed This Visit       Musculoskeletal and Integument   Spondylosis of cervical region without myelopathy or radiculopathy   FMLA updated during visit today.           Other   PTSD (post-traumatic stress disorder) - Primary   Chronic.  Doing well without medication.  She will graduate from school in 59 days.  Needs updated FMLA. Completed for patient during visit today.           Follow up plan: No follow-ups on file.

## 2024-05-31 NOTE — Patient Instructions (Signed)
 Advised to pursue a goal of 25 to 30 g of fiber daily.  The majority of this may be through natural sources, advised to ensure a minimal daily fiber supplementation.  Various forms of supplements discussed.  Recommend Psyllium husk, with options of mixing with beverage or applesauce to make more tolerable. Strongly advised to consume more fluids(especially in proximity to fiber intake) and to ensure adequate hydration.   Watch color of urine to determine adequacy of hydration.  Clarity is pursued in urine output, and bowel activity that responds and corresponds to significant meal intake.   We need to avoid deferring having bowel movements, advised to take the time at the first sign of sensation, typically following meals, and in the morning.  The need to avoid more frequently, and the presence of flatus may indicate the need for bowel movement.  Do not defer for later.  Addition of MiraLAX (or its generic equivalent) may be needed ensure at least twice daily bowel movements.  If multiple doses of MiraLAX are necessary utilize them. Never skip a day... Do not tolerate a day without a bowel movement unless you are fasting.  To be regular, we must do the above EVERY day.   Soluble Fiber Dissolves in Water: Soluble fiber dissolves in water to form a gel-like substance. Slows Digestion: This type of fiber slows down digestion, which can help control blood sugar levels and lower cholesterol. Sources: Common sources include oats, beans, apples, citrus fruits, and psyllium. Benefits: Helps manage cholesterol levels. Aids in blood sugar control. Increases healthy gut bacteria, which can lower inflammation and improve digestion.  Insoluble Fiber Does Not Dissolve in Water: Insoluble fiber does not dissolve in water and remains mostly intact as it passes through the digestive system. Adds Bulk to Stool: It adds bulk to stool, which helps promote regular bowel movements and prevent constipation. Sources:  Common sources include whole grains, nuts, beans, and vegetables like cauliflower and potatoes. Benefits: Improves bowel health and regularity. Reduces the risk of colorectal conditions like hemorrhoids and diverticulitis. Supports insulin sensitivity in people with diabetes.  Both types of fiber are essential for overall health, and it's beneficial to include a 50/50 mix of both in your diet.    Hemorrhoids Hemorrhoids are swollen veins that may form: In the butt (rectum). These are called internal hemorrhoids. Around the opening of the butt (anus). These are called external hemorrhoids. Most hemorrhoids do not cause very bad problems. They often get better with changes to your lifestyle and what you eat. What are the causes? Having trouble pooping (constipation) or watery poop (diarrhea). Pushing too hard when you poop. Pregnancy. Being very overweight (obese). Sitting for too long. Riding a bike for a long time. Heavy lifting or other things that take a lot of effort. Anal sex. What are the signs or symptoms? Pain. Itching or soreness in the butt. Bleeding from the butt. Leaking poop. Swelling. One or more lumps around the opening of your butt. How is this treated? In most cases, hemorrhoids can be treated at home. You may be told to: Change what you eat. Make changes to your lifestyle. If these treatments do not help, you may need to have a procedure done. Your doctor may need to: Place rubber bands at the bottom of the hemorrhoids to make them fall off. Put medicine into the hemorrhoids to shrink them. Shine a type of light on the hemorrhoids to cause them to fall off. Do surgery to get rid of the hemorrhoids.  Follow these instructions at home: Medicines Take over-the-counter and prescription medicines only as told by your doctor. Use creams with medicine in them or medicines that you put in your butt as told by your doctor. Eating and drinking  Eat foods that have a  lot of fiber in them. These include whole grains, beans, nuts, fruits, and vegetables. Ask your doctor about taking products that have fiber added to them (fibersupplements). Take in less fat. You can do this by: Eating low-fat dairy products. Eating less red meat. Staying away from processed foods. Drink enough fluid to keep your pee (urine) pale yellow. Managing pain and swelling  Take a warm-water bath (sitz bath) for 20 minutes to ease pain. Do this 3-4 times a day. You may do this in a bathtub. You may also use a portable sitz bath that fits over the toilet. If told, put ice on the painful area. It may help to use ice between your warm baths. Put ice in a plastic bag. Place a towel between your skin and the bag. Leave the ice on for 20 minutes, 2-3 times a day. If your skin turns bright red, take off the ice right away to prevent skin damage. The risk of damage is higher if you cannot feel pain, heat, or cold. General instructions Exercise. Ask your doctor how much and what kind of exercise is best for you. Go to the bathroom when you need to poop. Do not wait. Try not to push too hard when you poop. Keep your butt dry and clean. Use wet toilet paper or moist towelettes after you poop. Do not sit on the toilet for a long time. Contact a doctor if: You have pain and swelling that do not get better with treatment. You have trouble pooping. You cannot poop. You have pain or swelling outside the area of the hemorrhoids. Get help right away if: You have bleeding from the butt that will not stop. This information is not intended to replace advice given to you by your health care provider. Make sure you discuss any questions you have with your health care provider. Document Revised: 04/16/2022 Document Reviewed: 04/16/2022 Elsevier Patient Education  2024 [employer].

## 2024-05-31 NOTE — Assessment & Plan Note (Signed)
 FMLA updated during visit today.

## 2024-05-31 NOTE — Assessment & Plan Note (Signed)
 Chronic.  Doing well without medication.  She will graduate from school in 59 days.  Needs updated FMLA. Completed for patient during visit today.

## 2024-05-31 NOTE — Progress Notes (Signed)
 Patient ID: Stacy Yang, female   DOB: 01-10-1981, 43 y.o.   MRN: [id number] CC: Hemorrhoids History of Present Illness Stacy Yang is a 43 y.o. female with past medical history as below who presents in consultation for hemorrhoids.  The patient reports that she has had trouble with hemorrhoids for 25 years but this episode started approximately 2 to 3 months ago.  She reports that since then she has had increased pain with defecation.  She describes the pain as sharp and as passing glass.  She says that this was accompanied by some constipation initially.  She also reports that she saw bright red blood in her stool.  She does also report burning.  She has tried suppositories and Tucks pads but that actually made it worse.  She has recently started to try to eat better and increase her water intake as well as take a fiber supplementation and reports that this is helping tremendously.  She will last a colonoscopy in 2021..  This was significant for very small external hemorrhoids.  Past Medical History Past Medical History:  Diagnosis Date   Acne    Asthma    Cardiomyopathy (HCC)    Family history of lung cancer    Family history of pancreatic cancer      PTSD  Past Surgical History:  Procedure Laterality Date   CESAREAN SECTION     x3   COLONOSCOPY WITH PROPOFOL  N/A 11/07/2019   Procedure: COLONOSCOPY WITH PROPOFOL ;  Surgeon: Unk Corinn Skiff, MD;  Location: [facility];  Service: Gastroenterology;  Laterality: N/A;   ESOPHAGOGASTRODUODENOSCOPY (EGD) WITH PROPOFOL  N/A 07/15/2017   Procedure: ESOPHAGOGASTRODUODENOSCOPY (EGD) WITH PROPOFOL ;  Surgeon: Unk Corinn Skiff, MD;  Location: [facility];  Service: Endoscopy;  Laterality: N/A;   TUBAL LIGATION      Allergies  Allergen Reactions   Elemental Sulfur Hives   Iodinated Contrast Media Hives   Iodine Swelling    Current Outpatient Medications  Medication Sig Dispense Refill   cyclobenzaprine  (FLEXERIL ) 5 MG  tablet Take 0.5-1 tablets (2.5-5 mg total) by mouth at bedtime. 30 tablet 0   ipratropium (ATROVENT ) 0.06 % nasal spray Place 2 sprays into both nostrils 4 (four) times daily. 15 mL 12   naproxen  (NAPROSYN ) 500 MG tablet Take 1 tablet (500 mg total) by mouth 2 (two) times daily with a meal. 60 tablet 0   No current facility-administered medications for this visit.    Family History Family History  Problem Relation Age of Onset   Kidney disease Mother    Alcohol abuse Father    Lung cancer Father    Alcohol abuse Maternal Aunt    Diabetes Maternal Aunt    Pancreatic cancer Paternal Uncle    Diabetes Maternal Grandmother    Dementia Paternal Grandmother    Cirrhosis Paternal Grandfather    Pancreatic cancer Paternal Grandfather        Social History Social History   Tobacco Use   Smoking status: Never   Smokeless tobacco: Never  Vaping Use   Vaping status: Never Used  Substance Use Topics   Alcohol use: Not Currently   Drug use: Never        ROS Full ROS of systems performed and is otherwise negative there than what is stated in the HPI  Physical Exam Blood pressure 122/81, pulse 77, temperature 98.1 F (36.7 C), temperature source Oral, height 5' (1.524 m), weight 202 lb 6.4 oz (91.8 kg), SpO2 98%.  Alert and oriented x  3, normal work of breathing room air, regular rate and rhythm, abdomen is soft, nontender nondistended, rectal exam performed in the presence of a chaperone.  I digital rectal exam there are no large external hemorrhoids.  There is some redundant skin anteriorly.  On digital rectal exam there are no dominant masses or lesions.  There is no gross blood.  Anoscopy performed.  There are grade 1 hemorrhoidal column without any evidence of bleeding.  Data Reviewed Reviewed patient's colonoscopy.  This was significant for small external hemorrhoids.  I have personally reviewed the patient's imaging and medical records.    Assessment    Patient with pain  with defecation and hemorrhoid complaints.  She mostly has pain.  She has been using fiber and water supplementation to help her stools become more soft and this has improved her symptoms.  She does not have any fissure on exam and she has grade 1 hemorrhoids.  Recommend continued fiber supplementation twice daily.  We will plan to see her again in 8 weeks to see how she is doing.  A total of was spent reviewing the patient's chart, performing history and physical and discussing treatment options with the patient.  This is irrespective of time spent for procedure.    Jayson MALVA Endow 05/31/2024, 10:52 AM

## 2024-06-16 ENCOUNTER — Telehealth: Admitting: Physician Assistant

## 2024-06-16 DIAGNOSIS — J029 Acute pharyngitis, unspecified: Secondary | ICD-10-CM | POA: Diagnosis not present

## 2024-06-16 MED ORDER — LIDOCAINE VISCOUS HCL 2 % MT SOLN: 15.0000 mL | OROMUCOSAL | 0 refills | Status: AC | PRN

## 2024-06-16 MED ORDER — AMOXICILLIN 500 MG PO TABS: 500.0000 mg | ORAL_TABLET | Freq: Two times a day (BID) | ORAL | 0 refills | Status: AC

## 2024-06-16 NOTE — Progress Notes (Signed)
 Message sent to patient requesting further input regarding current symptoms. Awaiting patient response.

## 2024-06-16 NOTE — Progress Notes (Signed)
 We are sorry that you are not feeling well.  Here is how we plan to help!  Your symptoms indicate a likely viral infection (Pharyngitis).   Pharyngitis is inflammation in the back of the throat which can cause a sore throat, scratchiness and sometimes difficulty swallowing.   Pharyngitis is typically caused by a respiratory virus and will just run its course.  Please keep in mind that your symptoms could last up to 10 days.    For throat pain, we recommend over the counter oral pain relief medications such as acetaminophen  or aspirin, or anti-inflammatory medications such as ibuprofen or naproxen  sodium.  Topical treatments such as oral throat lozenges or sprays may be used as needed. For throat pain, I have prescribed a Viscous Lidocaine  2% solution. Swallow 5-10 mL every 4-6 hours as needed for sore throat. DO NOT eat or drink anything for 15-20 minutes after swallowing to allow the medication to coat the throat.SABRA  Avoid close contact with loved ones, especially the very young and elderly.  Remember to wash your hands thoroughly throughout the day as this is the number one way to prevent the spread of infection! We also recommend that you periodically wipe down door knobs and counters with disinfectant.  I know you mentioned the potential strep throat exposure. Thankfully the throat does not look extremely red, nor any tonsillar swelling or white patches on the tonsils themselves in the picture sent. This makes it less likely that strep is the cause -- however, if symptoms are not turning the corner over next couple of days, or you note any increase in symptoms or not white patches on the tonsils themselves, I have placed an antibiotic on file at the pharmacy to take as directed.   Home Care: Only take medications as instructed by your medical team. Do not drink alcohol while taking these medications. A steam or ultrasonic humidifier can help congestion.  You can place a towel over your head and  breathe in the steam from hot water coming from a faucet. Avoid close contacts especially the very young and the elderly. Cover your mouth when you cough or sneeze. Always remember to wash your hands.  Get Help Right Away If: You develop worsening fever or throat pain. You develop a severe head ache or visual changes. Your symptoms persist after you have completed your treatment plan.  Make sure you Understand these instructions. Will watch your condition. Will get help right away if you are not doing well or get worse.  Your e-visit answers were reviewed by a board certified advanced clinical practitioner to complete your personal care plan.  Depending on the condition, your plan could have included both over the counter or prescription medications.  If there is a problem please reply once you have received a response from your provider.  Your safety is important to us .  If you have drug allergies check your prescription carefully.    You can use MyChart to ask questions about todays visit, request a non-urgent call back, or ask for a work or school excuse for 24 hours related to this e-Visit. If it has been greater than 24 hours you will need to follow up with your provider, or enter a new e-Visit to address those concerns.  You will get an e-mail in the next two days asking about your experience.  I hope that your e-visit has been valuable and will speed your recovery. Thank you for using e-visits.   I have spent 8  minutes in review of e-visit questionnaire, review and updating patient chart, medical decision making and response to patient.   Elsie Velma Lunger, PA-C

## 2024-07-21 ENCOUNTER — Ambulatory Visit: Admitting: Nurse Practitioner

## 2024-07-21 ENCOUNTER — Encounter: Payer: Self-pay | Admitting: Nurse Practitioner

## 2024-07-21 VITALS — BP 112/76 | HR 69 | Temp 98.3°F | Ht 61.0 in | Wt 200.8 lb

## 2024-07-21 DIAGNOSIS — Z136 Encounter for screening for cardiovascular disorders: Secondary | ICD-10-CM | POA: Diagnosis not present

## 2024-07-21 DIAGNOSIS — Z0000 Encounter for general adult medical examination without abnormal findings: Secondary | ICD-10-CM

## 2024-07-21 DIAGNOSIS — Z1231 Encounter for screening mammogram for malignant neoplasm of breast: Secondary | ICD-10-CM

## 2024-07-21 DIAGNOSIS — F419 Anxiety disorder, unspecified: Secondary | ICD-10-CM | POA: Diagnosis not present

## 2024-07-21 DIAGNOSIS — F32A Depression, unspecified: Secondary | ICD-10-CM

## 2024-07-21 NOTE — Assessment & Plan Note (Signed)
 Chronic.  Ongoing. Continue with therapy.  Doing well without medication.  Feels like she is doing well with concerns.  Follow up in 6 months.  Call sooner if concerns arise.

## 2024-07-21 NOTE — Progress Notes (Signed)
 BP 112/76 (BP Location: Left Arm, Patient Position: Sitting, Cuff Size: Large)   Pulse 69   Temp 98.3 F (36.8 C) (Oral)   Ht 5' 1 (1.549 m)   Wt 200 lb 12.8 oz (91.1 kg)   LMP 07/18/2024 (Exact Date)   SpO2 98%   BMI 37.94 kg/m    Subjective:    Patient ID: Stacy Yang, female    DOB: September 11, 1980, 43 y.o.   MRN: [id number]  HPI: Stacy Yang is a 43 y.o. female presenting on 07/21/2024 for comprehensive medical examination. Current medical complaints include: pre menopause  She currently lives with: Menopausal Symptoms: no  Patient states her periods are very irregular.  They are lasting longer and not coming regularly.  She is having hot flashes also.   MOOD Has been a little bit more moody lately and emotional.  She has been crying more lately.    Depression Screen done today and results listed below:     07/21/2024    8:16 AM 05/31/2024    3:51 PM 05/16/2024   11:30 AM 01/13/2024   10:30 AM 12/02/2023   11:04 AM  Depression screen PHQ 2/9  Decreased Interest 1 2 2 2  0  Down, Depressed, Hopeless 1 0 1 1 0  PHQ - 2 Score 2 2 3 3  0  Altered sleeping 2 0 0 1   Tired, decreased energy 2 3 2 1    Change in appetite 1 1 2 3    Feeling bad or failure about yourself  1 0 0 0   Trouble concentrating 1 0 0 1   Moving slowly or fidgety/restless 1 0 0 0   Suicidal thoughts 0 0 0 0   PHQ-9 Score 10 6  7  9     Difficult doing work/chores Somewhat difficult Somewhat difficult Somewhat difficult Somewhat difficult      Data saved with a previous flowsheet row definition    The patient does not have a history of falls. I did complete a risk assessment for falls. A plan of care for falls was documented.   Past Medical History:  Past Medical History:  Diagnosis Date   Acne    Allergy    Anxiety    Asthma    Cardiomyopathy (HCC)    Depression    Family history of lung cancer    Family history of pancreatic cancer    Sleep apnea     Surgical History:  Past  Surgical History:  Procedure Laterality Date   CESAREAN SECTION     x3   COLONOSCOPY WITH PROPOFOL  N/A 11/07/2019   Procedure: COLONOSCOPY WITH PROPOFOL ;  Surgeon: Unk Corinn Skiff, MD;  Location: [facility];  Service: Gastroenterology;  Laterality: N/A;   ESOPHAGOGASTRODUODENOSCOPY (EGD) WITH PROPOFOL  N/A 07/15/2017   Procedure: ESOPHAGOGASTRODUODENOSCOPY (EGD) WITH PROPOFOL ;  Surgeon: Unk Corinn Skiff, MD;  Location: [facility];  Service: Endoscopy;  Laterality: N/A;   TUBAL LIGATION      Medications:  Current Outpatient Medications on File Prior to Visit  Medication Sig   cyclobenzaprine  (FLEXERIL ) 5 MG tablet Take 0.5-1 tablets (2.5-5 mg total) by mouth at bedtime.   ipratropium (ATROVENT ) 0.06 % nasal spray Place 2 sprays into both nostrils 4 (four) times daily.   lidocaine  (XYLOCAINE ) 2 % solution Use as directed 15 mLs in the mouth or throat as needed for mouth pain.   naproxen  (NAPROSYN ) 500 MG tablet Take 1 tablet (500 mg total) by mouth 2 (two) times daily with a  meal.   No current facility-administered medications on file prior to visit.    Allergies:  Allergies  Allergen Reactions   Elemental Sulfur Hives   Iodinated Contrast Media Hives   Iodine Swelling    Social History:  Social History   Socioeconomic History   Marital status: Divorced    Spouse name: Not on file   Number of children: Not on file   Years of education: Not on file   Highest education level: Associate degree: academic program  Occupational History   Not on file  Tobacco Use   Smoking status: Never   Smokeless tobacco: Never  Vaping Use   Vaping status: Never Used  Substance and Sexual Activity   Alcohol use: Not Currently   Drug use: Never   Sexual activity: Yes    Birth control/protection: Surgical  Other Topics Concern   Not on file  Social History Narrative   Dental asst at the [employer]   Daughters    Son died Aug 04, 2020 GSW   1 grandaughter 2 y.o as of 07/24/20     Social Drivers of Health   Financial Resource Strain: High Risk (05/31/2024)   Overall Financial Resource Strain (CARDIA)    Difficulty of Paying Living Expenses: Hard  Food Insecurity: Food Insecurity Present (05/31/2024)   Hunger Vital Sign    Worried About Running Out of Food in the Last Year: Sometimes true    Ran Out of Food in the Last Year: Sometimes true  Transportation Needs: No Transportation Needs (05/31/2024)   PRAPARE - Administrator, Civil Service (Medical): No    Lack of Transportation (Non-Medical): No  Physical Activity: Inactive (05/31/2024)   Exercise Vital Sign    Days of Exercise per Week: 0 days    Minutes of Exercise per Session: Not on file  Stress: Stress Concern Present (05/31/2024)   [employer] of Occupational Health - Occupational Stress Questionnaire    Feeling of Stress: Very much  Social Connections: Moderately Isolated (05/31/2024)   Social Connection and Isolation Panel    Frequency of Communication with Friends and Family: Once a week    Frequency of Social Gatherings with Friends and Family: Never    Attends Religious Services: More than 4 times per year    Active Member of [employer] or Organizations: No    Attends Engineer, Structural: Not on file    Marital Status: Living with partner  Intimate Partner Violence: Not on file   Social History   Tobacco Use  Smoking Status Never  Smokeless Tobacco Never   Social History   Substance and Sexual Activity  Alcohol Use Not Currently    Family History:  Family History  Problem Relation Age of Onset   Kidney disease Mother    Alcohol abuse Father    Lung cancer Father    Alcohol abuse Maternal Aunt    Diabetes Maternal Aunt    Pancreatic cancer Paternal Uncle    Diabetes Maternal Grandmother    Dementia Paternal Grandmother    Cirrhosis Paternal Grandfather    Pancreatic cancer Paternal Grandfather     Past medical history, surgical history, medications,  allergies, family history and social history reviewed with patient today and changes made to appropriate areas of the chart.   Review of Systems  Psychiatric/Behavioral:  Positive for depression. Negative for suicidal ideas. The patient is nervous/anxious.    All other ROS negative except what is listed above and in the HPI.  Objective:    BP 112/76 (BP Location: Left Arm, Patient Position: Sitting, Cuff Size: Large)   Pulse 69   Temp 98.3 F (36.8 C) (Oral)   Ht 5' 1 (1.549 m)   Wt 200 lb 12.8 oz (91.1 kg)   LMP 07/18/2024 (Exact Date)   SpO2 98%   BMI 37.94 kg/m   Wt Readings from Last 3 Encounters:  07/21/24 200 lb 12.8 oz (91.1 kg)  05/31/24 203 lb 3.2 oz (92.2 kg)  05/31/24 202 lb 6.4 oz (91.8 kg)    Physical Exam Vitals and nursing note reviewed.  Constitutional:      General: She is awake. She is not in acute distress.    Appearance: Normal appearance. She is well-developed. She is obese. She is not ill-appearing.  HENT:     Head: Normocephalic and atraumatic.     Right Ear: Hearing, tympanic membrane, ear canal and external ear normal. No drainage.     Left Ear: Hearing, tympanic membrane, ear canal and external ear normal. No drainage.     Nose: Nose normal.     Right Sinus: No maxillary sinus tenderness or frontal sinus tenderness.     Left Sinus: No maxillary sinus tenderness or frontal sinus tenderness.     Mouth/Throat:     Mouth: Mucous membranes are moist.     Pharynx: Oropharynx is clear. Uvula midline. No pharyngeal swelling, oropharyngeal exudate or posterior oropharyngeal erythema.  Eyes:     General: Lids are normal.        Right eye: No discharge.        Left eye: No discharge.     Extraocular Movements: Extraocular movements intact.     Conjunctiva/sclera: Conjunctivae normal.     Pupils: Pupils are equal, round, and reactive to light.     Visual Fields: Right eye visual fields normal and left eye visual fields normal.  Neck:     Thyroid : No  thyromegaly.     Vascular: No carotid bruit.     Trachea: Trachea normal.  Cardiovascular:     Rate and Rhythm: Normal rate and regular rhythm.     Heart sounds: Normal heart sounds. No murmur heard.    No gallop.  Pulmonary:     Effort: Pulmonary effort is normal. No accessory muscle usage or respiratory distress.     Breath sounds: Normal breath sounds.  Chest:  Breasts:    Right: Normal.     Left: Normal.  Abdominal:     General: Bowel sounds are normal.     Palpations: Abdomen is soft. There is no hepatomegaly or splenomegaly.     Tenderness: There is no abdominal tenderness.  Musculoskeletal:        General: Normal range of motion.     Cervical back: Normal range of motion and neck supple.     Right lower leg: No edema.     Left lower leg: No edema.  Lymphadenopathy:     Head:     Right side of head: No submental, submandibular, tonsillar, preauricular or posterior auricular adenopathy.     Left side of head: No submental, submandibular, tonsillar, preauricular or posterior auricular adenopathy.     Cervical: No cervical adenopathy.     Upper Body:     Right upper body: No supraclavicular, axillary or pectoral adenopathy.     Left upper body: No supraclavicular, axillary or pectoral adenopathy.  Skin:    General: Skin is warm and dry.     Capillary Refill:  Capillary refill takes less than 2 seconds.     Findings: No rash.  Neurological:     Mental Status: She is alert and oriented to person, place, and time.     Gait: Gait is intact.  Psychiatric:        Attention and Perception: Attention normal.        Mood and Affect: Mood normal.        Speech: Speech normal.        Behavior: Behavior normal. Behavior is cooperative.        Thought Content: Thought content normal.        Judgment: Judgment normal.     Results for orders placed or performed in visit on 06/25/23  Microscopic Examination   Collection Time: 06/25/23  3:51 PM   Urine  Result Value Ref Range    WBC, UA 0-5 0 - 5 /hpf   RBC, Urine >30R 0 - 2 /hpf   Epithelial Cells (non renal) 0-10 0 - 10 /hpf   Bacteria, UA None seen None seen/Few  Urinalysis, Routine w reflex microscopic   Collection Time: 06/25/23  3:51 PM  Result Value Ref Range   Specific Gravity, UA 1.020 1.005 - 1.030   pH, UA 7.5 5.0 - 7.5   Color, UA Red (A) Yellow   Appearance Ur Cloudy (A) Clear   Leukocytes,UA 1+ (A) Negative   Protein,UA 1+ (A) Negative/Trace   Glucose, UA Negative Negative   Ketones, UA Negative Negative   RBC, UA 3+ (A) Negative   Bilirubin, UA Negative Negative   Urobilinogen, Ur 0.2 0.2 - 1.0 mg/dL   Nitrite, UA Negative Negative   Microscopic Examination See below:   CBC with Differential/Platelet   Collection Time: 06/25/23  3:56 PM  Result Value Ref Range   WBC 9.2 3.4 - 10.8 x10E3/uL   RBC 4.47 3.77 - 5.28 x10E6/uL   Hemoglobin 13.2 11.1 - 15.9 g/dL   Hematocrit 60.1 65.9 - 46.6 %   MCV 89 79 - 97 fL   MCH 29.5 26.6 - 33.0 pg   MCHC 33.2 31.5 - 35.7 g/dL   RDW 87.0 88.2 - 84.5 %   Platelets 247 150 - 450 x10E3/uL   Neutrophils 65 Not Estab. %   Lymphs 25 Not Estab. %   Monocytes 7 Not Estab. %   Eos 2 Not Estab. %   Basos 0 Not Estab. %   Neutrophils Absolute 6.0 1.4 - 7.0 x10E3/uL   Lymphocytes Absolute 2.3 0.7 - 3.1 x10E3/uL   Monocytes Absolute 0.7 0.1 - 0.9 x10E3/uL   EOS (ABSOLUTE) 0.1 0.0 - 0.4 x10E3/uL   Basophils Absolute 0.0 0.0 - 0.2 x10E3/uL   Immature Granulocytes 1 Not Estab. %   Immature Grans (Abs) 0.1 0.0 - 0.1 x10E3/uL  Comprehensive metabolic panel   Collection Time: 06/25/23  3:56 PM  Result Value Ref Range   Glucose 121 (H) 70 - 99 mg/dL   BUN 15 6 - 24 mg/dL   Creatinine, Ser 9.24 0.57 - 1.00 mg/dL   eGFR 898 >40 fO/fpw/8.26   BUN/Creatinine Ratio 20 9 - 23   Sodium 139 134 - 144 mmol/L   Potassium 4.0 3.5 - 5.2 mmol/L   Chloride 101 96 - 106 mmol/L   CO2 24 20 - 29 mmol/L   Calcium 9.2 8.7 - 10.2 mg/dL   Total Protein 6.8 6.0 - 8.5 g/dL    Albumin 4.2 3.9 - 4.9 g/dL   Globulin, Total 2.6 1.5 -  4.5 g/dL   Bilirubin Total 0.5 0.0 - 1.2 mg/dL   Alkaline Phosphatase 74 44 - 121 IU/L   AST 18 0 - 40 IU/L   ALT 17 0 - 32 IU/L  Lipid panel   Collection Time: 06/25/23  3:56 PM  Result Value Ref Range   Cholesterol, Total 172 100 - 199 mg/dL   Triglycerides 821 (H) 0 - 149 mg/dL   HDL 46 >60 mg/dL   VLDL Cholesterol Cal 31 5 - 40 mg/dL   LDL Chol Calc (NIH) 95 0 - 99 mg/dL   Chol/HDL Ratio 3.7 0.0 - 4.4 ratio  TSH   Collection Time: 06/25/23  3:56 PM  Result Value Ref Range   TSH 1.630 0.450 - 4.500 uIU/mL  Hemoglobin A1c   Collection Time: 06/25/23  3:56 PM  Result Value Ref Range   Hgb A1c MFr Bld 5.7 (H) 4.8 - 5.6 %   Est. average glucose Bld gHb Est-mCnc 117 mg/dL  Specimen status report   Collection Time: 06/25/23  3:56 PM  Result Value Ref Range   specimen status report Comment   Urine Culture   Collection Time: 06/26/23 11:52 AM   Specimen: Urine   UR  Result Value Ref Range   Urine Culture, Routine Final report    Organism ID, Bacteria Comment       Assessment & Plan:   Problem List Items Addressed This Visit       Other   Anxiety and depression   Chronic.  Ongoing. Continue with therapy.  Doing well without medication.  Feels like she is doing well with concerns.  Follow up in 6 months.  Call sooner if concerns arise.       Other Visit Diagnoses       Annual physical exam    -  Primary   Health maintenance reviewed during visit today.  Labs ordered.  Vaccines reviewed.  PAP up to date.  Mammogram ordered.   Relevant Orders   CBC with Differential/Platelet   Comprehensive metabolic panel with GFR   Lipid panel   TSH     Screening for ischemic heart disease       Relevant Orders   Lipid panel     Encounter for screening mammogram for malignant neoplasm of breast       Relevant Orders   MM 3D SCREENING MAMMOGRAM BILATERAL BREAST          Follow up plan: Return in about 6 months (around  01/19/2025) for Depression/Anxiety FU.   LABORATORY TESTING:  - Pap smear: up to date  IMMUNIZATIONS:   - Tdap: Tetanus vaccination status reviewed: last tetanus booster within 10 years. - Influenza: Receives flu shot at work - Pneumovax: Not applicable - Prevnar: Not applicable - COVID: Not applicable - HPV: Not applicable - Shingrix vaccine: Not applicable  SCREENING: -Mammogram: Ordered today  - Colonoscopy: Not applicable  - Bone Density: Not applicable  -Hearing Test: Not applicable  -Spirometry: Not applicable   PATIENT COUNSELING:   Advised to take 1 mg of folate supplement per day if capable of pregnancy.   Sexuality: Discussed sexually transmitted diseases, partner selection, use of condoms, avoidance of unintended pregnancy  and contraceptive alternatives.   Advised to avoid cigarette smoking.  I discussed with the patient that most people either abstain from alcohol or drink within safe limits (<=14/week and <=4 drinks/occasion for males, <=7/weeks and <= 3 drinks/occasion for females) and that the risk for alcohol disorders and other health  effects rises proportionally with the number of drinks per week and how often a drinker exceeds daily limits.  Discussed cessation/primary prevention of drug use and availability of treatment for abuse.   Diet: Encouraged to adjust caloric intake to maintain  or achieve ideal body weight, to reduce intake of dietary saturated fat and total fat, to limit sodium intake by avoiding high sodium foods and not adding table salt, and to maintain adequate dietary potassium and calcium preferably from fresh fruits, vegetables, and low-fat dairy products.    stressed the importance of regular exercise  Injury prevention: Discussed safety belts, safety helmets, smoke detector, smoking near bedding or upholstery.   Dental health: Discussed importance of regular tooth brushing, flossing, and dental visits.    NEXT PREVENTATIVE PHYSICAL DUE  IN 1 YEAR. Return in about 6 months (around 01/19/2025) for Depression/Anxiety FU.

## 2024-07-21 NOTE — Patient Instructions (Signed)
 Please call to schedule your mammogram and/or bone density: [facility] at [facility]  Address: [address], [place], [place] [address] Phone: [phone]  [facility] at [facility] [address] [place],  [place]  [address] Phone: [phone]

## 2024-07-22 ENCOUNTER — Ambulatory Visit: Payer: Self-pay | Admitting: Nurse Practitioner

## 2024-07-22 LAB — CBC WITH DIFFERENTIAL/PLATELET
Basophils Absolute: 0 x10E3/uL (ref 0.0–0.2)
Basos: 0 %
EOS (ABSOLUTE): 0.2 x10E3/uL (ref 0.0–0.4)
Eos: 3 %
Hematocrit: 40.7 % (ref 34.0–46.6)
Hemoglobin: 12.9 g/dL (ref 11.1–15.9)
Immature Grans (Abs): 0 x10E3/uL (ref 0.0–0.1)
Immature Granulocytes: 0 %
Lymphocytes Absolute: 1.8 x10E3/uL (ref 0.7–3.1)
Lymphs: 28 %
MCH: 27.8 pg (ref 26.6–33.0)
MCHC: 31.7 g/dL (ref 31.5–35.7)
MCV: 88 fL (ref 79–97)
Monocytes Absolute: 0.4 x10E3/uL (ref 0.1–0.9)
Monocytes: 6 %
Neutrophils Absolute: 4.1 x10E3/uL (ref 1.4–7.0)
Neutrophils: 63 %
Platelets: 250 x10E3/uL (ref 150–450)
RBC: 4.64 x10E6/uL (ref 3.77–5.28)
RDW: 13.4 % (ref 11.7–15.4)
WBC: 6.5 x10E3/uL (ref 3.4–10.8)

## 2024-07-22 LAB — COMPREHENSIVE METABOLIC PANEL WITH GFR
ALT: 13 IU/L (ref 0–32)
AST: 16 IU/L (ref 0–40)
Albumin: 3.9 g/dL (ref 3.9–4.9)
Alkaline Phosphatase: 74 IU/L (ref 41–116)
BUN/Creatinine Ratio: 16 (ref 9–23)
BUN: 15 mg/dL (ref 6–24)
Bilirubin Total: 0.8 mg/dL (ref 0.0–1.2)
CO2: 23 mmol/L (ref 20–29)
Calcium: 9.3 mg/dL (ref 8.7–10.2)
Chloride: 102 mmol/L (ref 96–106)
Creatinine, Ser: 0.92 mg/dL (ref 0.57–1.00)
Globulin, Total: 2.6 g/dL (ref 1.5–4.5)
Glucose: 101 mg/dL — ABNORMAL HIGH (ref 70–99)
Potassium: 4.1 mmol/L (ref 3.5–5.2)
Sodium: 140 mmol/L (ref 134–144)
Total Protein: 6.5 g/dL (ref 6.0–8.5)
eGFR: 79 mL/min/1.73 (ref >59)

## 2024-07-22 LAB — LIPID PANEL
Chol/HDL Ratio: 3.6 ratio (ref 0.0–4.4)
Cholesterol, Total: 158 mg/dL (ref 100–199)
HDL: 44 mg/dL (ref >39)
LDL Chol Calc (NIH): 89 mg/dL (ref 0–99)
Triglycerides: 142 mg/dL (ref 0–149)
VLDL Cholesterol Cal: 25 mg/dL (ref 5–40)

## 2024-07-22 LAB — TSH: TSH: 1.73 u[IU]/mL (ref 0.450–4.500)

## 2024-07-26 ENCOUNTER — Ambulatory Visit: Admitting: General Surgery

## 2024-08-02 ENCOUNTER — Ambulatory Visit: Admitting: General Surgery

## 2024-08-02 ENCOUNTER — Encounter: Payer: Self-pay | Admitting: General Surgery

## 2024-08-02 VITALS — BP 114/80 | HR 73 | Temp 98.0°F | Ht 61.0 in | Wt 202.0 lb

## 2024-08-02 DIAGNOSIS — Z1211 Encounter for screening for malignant neoplasm of colon: Secondary | ICD-10-CM | POA: Diagnosis not present

## 2024-08-02 DIAGNOSIS — K640 First degree hemorrhoids: Secondary | ICD-10-CM | POA: Diagnosis not present

## 2024-08-02 NOTE — Patient Instructions (Signed)
 Advised to pursue a goal of 25 to 30 g of fiber daily.  The majority of this may be through natural sources, advised to ensure a minimal daily fiber supplementation.  Various forms of supplements discussed.  Recommend Psyllium husk, with options of mixing with beverage or applesauce to make more tolerable. Strongly advised to consume more fluids(especially in proximity to fiber intake) and to ensure adequate hydration.   Watch color of urine to determine adequacy of hydration.  Clarity is pursued in urine output, and bowel activity that responds and corresponds to significant meal intake.   We need to avoid deferring having bowel movements, advised to take the time at the first sign of sensation, typically following meals, and in the morning.  The need to avoid more frequently, and the presence of flatus may indicate the need for bowel movement.  Do not defer for later.  Addition of MiraLAX (or its generic equivalent) may be needed ensure at least twice daily bowel movements.  If multiple doses of MiraLAX are necessary utilize them. Never skip a day... Do not tolerate a day without a bowel movement unless you are fasting.  To be regular, we must do the above EVERY day.   Soluble Fiber Dissolves in Water: Soluble fiber dissolves in water to form a gel-like substance. Slows Digestion: This type of fiber slows down digestion, which can help control blood sugar levels and lower cholesterol. Sources: Common sources include oats, beans, apples, citrus fruits, and psyllium. Benefits: Helps manage cholesterol levels. Aids in blood sugar control. Increases healthy gut bacteria, which can lower inflammation and improve digestion.  Insoluble Fiber Does Not Dissolve in Water: Insoluble fiber does not dissolve in water and remains mostly intact as it passes through the digestive system. Adds Bulk to Stool: It adds bulk to stool, which helps promote regular bowel movements and prevent constipation. Sources:  Common sources include whole grains, nuts, beans, and vegetables like cauliflower and potatoes. Benefits: Improves bowel health and regularity. Reduces the risk of colorectal conditions like hemorrhoids and diverticulitis. Supports insulin sensitivity in people with diabetes.  Both types of fiber are essential for overall health, and it's beneficial to include a 50/50 mix of both in your diet.    Hemorrhoids Hemorrhoids are swollen veins that may form: In the butt (rectum). These are called internal hemorrhoids. Around the opening of the butt (anus). These are called external hemorrhoids. Most hemorrhoids do not cause very bad problems. They often get better with changes to your lifestyle and what you eat. What are the causes? Having trouble pooping (constipation) or watery poop (diarrhea). Pushing too hard when you poop. Pregnancy. Being very overweight (obese). Sitting for too long. Riding a bike for a long time. Heavy lifting or other things that take a lot of effort. Anal sex. What are the signs or symptoms? Pain. Itching or soreness in the butt. Bleeding from the butt. Leaking poop. Swelling. One or more lumps around the opening of your butt. How is this treated? In most cases, hemorrhoids can be treated at home. You may be told to: Change what you eat. Make changes to your lifestyle. If these treatments do not help, you may need to have a procedure done. Your doctor may need to: Place rubber bands at the bottom of the hemorrhoids to make them fall off. Put medicine into the hemorrhoids to shrink them. Shine a type of light on the hemorrhoids to cause them to fall off. Do surgery to get rid of the hemorrhoids.  Follow these instructions at home: Medicines Take over-the-counter and prescription medicines only as told by your doctor. Use creams with medicine in them or medicines that you put in your butt as told by your doctor. Eating and drinking  Eat foods that have a  lot of fiber in them. These include whole grains, beans, nuts, fruits, and vegetables. Ask your doctor about taking products that have fiber added to them (fibersupplements). Take in less fat. You can do this by: Eating low-fat dairy products. Eating less red meat. Staying away from processed foods. Drink enough fluid to keep your pee (urine) pale yellow. Managing pain and swelling  Take a warm-water bath (sitz bath) for 20 minutes to ease pain. Do this 3-4 times a day. You may do this in a bathtub. You may also use a portable sitz bath that fits over the toilet. If told, put ice on the painful area. It may help to use ice between your warm baths. Put ice in a plastic bag. Place a towel between your skin and the bag. Leave the ice on for 20 minutes, 2-3 times a day. If your skin turns bright red, take off the ice right away to prevent skin damage. The risk of damage is higher if you cannot feel pain, heat, or cold. General instructions Exercise. Ask your doctor how much and what kind of exercise is best for you. Go to the bathroom when you need to poop. Do not wait. Try not to push too hard when you poop. Keep your butt dry and clean. Use wet toilet paper or moist towelettes after you poop. Do not sit on the toilet for a long time. Contact a doctor if: You have pain and swelling that do not get better with treatment. You have trouble pooping. You cannot poop. You have pain or swelling outside the area of the hemorrhoids. Get help right away if: You have bleeding from the butt that will not stop. This information is not intended to replace advice given to you by your health care provider. Make sure you discuss any questions you have with your health care provider. Document Revised: 04/16/2022 Document Reviewed: 04/16/2022 Elsevier Patient Education  2024 [employer].

## 2024-08-03 ENCOUNTER — Other Ambulatory Visit: Payer: Self-pay

## 2024-08-03 ENCOUNTER — Telehealth: Payer: Self-pay

## 2024-08-03 DIAGNOSIS — Z1211 Encounter for screening for malignant neoplasm of colon: Secondary | ICD-10-CM

## 2024-08-03 MED ORDER — SUTAB 1479-225-188 MG PO TABS: 12.0000 | ORAL_TABLET | Freq: Two times a day (BID) | ORAL | 0 refills | Status: AC

## 2024-08-03 NOTE — Telephone Encounter (Signed)
 Gastroenterology Pre-Procedure Review  Request Date: 10/03/24 Requesting Physician: Dr. Melany  PATIENT REVIEW QUESTIONS: The patient responded to the following health history questions as indicated:    1. Are you having any GI issues? yes (hemorrhoids) 2. Do you have a personal history of Polyps? no 3. Do you have a family history of Colon Cancer or Polyps? no 4. Diabetes Mellitus? no 5. Joint replacements in the past 12 months?no 6. Major health problems in the past 3 months?no 7. Any artificial heart valves, MVP, or defibrillator?no    MEDICATIONS & ALLERGIES:    Patient reports the following regarding taking any anticoagulation/antiplatelet therapy:   Plavix, Coumadin, Eliquis, Xarelto, Lovenox, Pradaxa, Brilinta, or Effient? no Aspirin? no  Patient confirms/reports the following medications:  Current Outpatient Medications  Medication Sig Dispense Refill   cyclobenzaprine  (FLEXERIL ) 5 MG tablet Take 0.5-1 tablets (2.5-5 mg total) by mouth at bedtime. 30 tablet 0   ipratropium (ATROVENT ) 0.06 % nasal spray Place 2 sprays into both nostrils 4 (four) times daily. 15 mL 12   lidocaine  (XYLOCAINE ) 2 % solution Use as directed 15 mLs in the mouth or throat as needed for mouth pain. 200 mL 0   naproxen  (NAPROSYN ) 500 MG tablet Take 1 tablet (500 mg total) by mouth 2 (two) times daily with a meal. 60 tablet 0   Sodium Sulfate-Mag Sulfate-KCl (SUTAB ) 337 290 2167 MG TABS Take 12 tablets by mouth 2 (two) times daily for 1 day. 24 tablet 0   No current facility-administered medications for this visit.    Patient confirms/reports the following allergies:  Allergies[1]  No orders of the defined types were placed in this encounter.   AUTHORIZATION INFORMATION Primary Insurance: 1D#: Group #:  Secondary Insurance: 1D#: Group #:  SCHEDULE INFORMATION: Date: 10/03/24 Time: Location: [facility]    [1]  Allergies Allergen Reactions   Elemental Sulfur Hives   Iodinated Contrast  Media Hives   Iodine Swelling

## 2024-08-03 NOTE — Progress Notes (Signed)
 Outpatient Surgical Follow Up   Stacy Yang is an 43 y.o. female.   Chief Complaint  Patient presents with   Follow-up    hemorrhoids    HPI: Patient returns today in follow-up for hemorrhoids.  At her last visit she was having a significant amount of pain and had some bleeding.  She had just started a good bowel regimen with fiber daily and MiraLAX as needed.  She says that since our last visit she has done relatively well.  Her pain is only intermittent and is relieved with over-the-counter Tucks pads and Preparation H.  She said that when she was taking the fiber she had nice soft stools and did not have to strain.  She says that she did have 1 flareup and had a little bit of bleeding with bowel movements but has not had any in several weeks.  She does have a history of having a colonoscopy but turns 44 next year.  Past Medical History:  Diagnosis Date   Acne    Allergy    Anxiety    Asthma    Cardiomyopathy (HCC)    Depression    Family history of lung cancer    Family history of pancreatic cancer    Sleep apnea     Past Surgical History:  Procedure Laterality Date   CESAREAN SECTION     x3   COLONOSCOPY WITH PROPOFOL  N/A 11/07/2019   Procedure: COLONOSCOPY WITH PROPOFOL ;  Surgeon: Unk Corinn Skiff, MD;  Location: [facility];  Service: Gastroenterology;  Laterality: N/A;   ESOPHAGOGASTRODUODENOSCOPY (EGD) WITH PROPOFOL  N/A 07/15/2017   Procedure: ESOPHAGOGASTRODUODENOSCOPY (EGD) WITH PROPOFOL ;  Surgeon: Unk Corinn Skiff, MD;  Location: [facility];  Service: Endoscopy;  Laterality: N/A;   TUBAL LIGATION      Family History  Problem Relation Age of Onset   Kidney disease Mother    Alcohol abuse Father    Lung cancer Father    Alcohol abuse Maternal Aunt    Diabetes Maternal Aunt    Pancreatic cancer Paternal Uncle    Diabetes Maternal Grandmother    Dementia Paternal Grandmother    Cirrhosis Paternal Grandfather    Pancreatic cancer Paternal  Grandfather     Social History:  reports that she has never smoked. She has never used smokeless tobacco. She reports that she does not currently use alcohol. She reports that she does not use drugs.  Allergies: Allergies[1]  Medications reviewed.    ROS Full ROS performed and is otherwise negative other than what is stated in HPI   BP 114/80   Pulse 73   Temp 98 F (36.7 C) (Oral)   Ht 5' 1 (1.549 m)   Wt 202 lb (91.6 kg)   LMP 07/18/2024 (Exact Date)   SpO2 100%   BMI 38.17 kg/m   Physical Exam Alert and oriented x 3, normal work of breathing on room air, regular rate and rhythm, abdomen soft, nontender not distended, rectal exam performed in the presence of a chaperone.  Externally she does have some external hemorrhoids that are small.  On digital rectal exam there is no gross blood.  Anoscopy performed and she has grade 1 hemorrhoids.    No results found for this or any previous visit (from the past 48 hours). No results found.  Assessment/Plan:  1. Screen for colon cancer (Primary) Patient with grade 1 hemorrhoids and she seems to be managing it well with fiber supplementation and good bowel habits.  I encouraged her  to continue this to avoid need for surgery.  She turns 44 next year and will need a screening colonoscopy.  We will set her up to have this done with me. - Ambulatory referral to Gastroenterology  A total of 25 minutes was spent reviewing the patient's chart, performing history and physical and discussing treatment options with the patient.  This was irrespective of the time spent performing anoscopy  Jayson Endow, M.D. [facility]     [1]  Allergies Allergen Reactions   Elemental Sulfur Hives   Iodinated Contrast Media Hives   Iodine Swelling

## 2024-09-14 ENCOUNTER — Other Ambulatory Visit

## 2024-09-14 ENCOUNTER — Telehealth: Payer: Self-pay

## 2024-09-14 DIAGNOSIS — Z0289 Encounter for other administrative examinations: Secondary | ICD-10-CM

## 2024-09-14 NOTE — Telephone Encounter (Unsigned)
 Copied from CRM [id number]. Topic: Clinical - Request for Lab/Test Order >> Sep 14, 2024 10:40 AM Lonell PEDLAR wrote: Reason for CRM: Patient stated she needs to complete TB test for work/school. 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 [phone]

## 2024-09-14 NOTE — Telephone Encounter (Signed)
 Copied from CRM [id number]. Topic: Clinical - Request for Lab/Test Order >> Sep 14, 2024 10:40 AM Lonell PEDLAR wrote: Reason for CRM: Patient stated she needs to complete TB test for work/school. 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 [phone]

## 2024-09-14 NOTE — Telephone Encounter (Signed)
 Order placed

## 2024-09-14 NOTE — Telephone Encounter (Signed)
 Patient has been scheduled to come in today for blood draw.

## 2024-09-17 LAB — QUANTIFERON-TB GOLD PLUS
QuantiFERON Mitogen Value: >10 [IU]/mL
QuantiFERON Nil Value: 0.15 [IU]/mL
QuantiFERON TB1 Ag Value: 0.17 [IU]/mL
QuantiFERON TB2 Ag Value: 0.2 [IU]/mL

## 2024-09-20 ENCOUNTER — Ambulatory Visit: Payer: Self-pay | Admitting: Nurse Practitioner

## 2024-09-20 NOTE — Progress Notes (Signed)
 Message left for patient to stop by office.

## 2024-10-17 ENCOUNTER — Ambulatory Visit: Admit: 2024-10-17 | Admitting: Gastroenterology

## 2025-01-19 ENCOUNTER — Ambulatory Visit: Admitting: Nurse Practitioner
